# Patient Record
Sex: Female | Born: 1975 | Race: Black or African American | Hispanic: No | Marital: Single | Smoking: Current every day smoker
Health system: Southern US, Community
[De-identification: ages and names within clinical notes are randomized; demographics above are authoritative.]

## PROBLEM LIST (undated history)

## (undated) ENCOUNTER — Emergency Department: Payer: BC Managed Care – PPO

## (undated) DIAGNOSIS — K859 Acute pancreatitis without necrosis or infection, unspecified: Secondary | ICD-10-CM

## (undated) DIAGNOSIS — Z8619 Personal history of other infectious and parasitic diseases: Secondary | ICD-10-CM

## (undated) HISTORY — PX: OTHER SURGICAL HISTORY: SHX169

## (undated) HISTORY — DX: Personal history of other infectious and parasitic diseases: Z86.19

---

## 2009-02-17 ENCOUNTER — Emergency Department: Payer: Self-pay | Admitting: Emergency Medicine

## 2009-05-29 ENCOUNTER — Emergency Department: Payer: Self-pay | Admitting: Emergency Medicine

## 2010-01-18 ENCOUNTER — Emergency Department: Payer: Self-pay | Admitting: Emergency Medicine

## 2012-06-24 ENCOUNTER — Emergency Department: Payer: Self-pay

## 2012-06-24 LAB — URINALYSIS, COMPLETE
Bacteria: NONE SEEN
Blood: NEGATIVE
Nitrite: NEGATIVE
Ph: 7 (ref 4.5–8.0)
Protein: NEGATIVE
Squamous Epithelial: 5

## 2012-06-24 LAB — COMPREHENSIVE METABOLIC PANEL
Albumin: 3.6 g/dL (ref 3.4–5.0)
BUN: 7 mg/dL (ref 7–18)
Bilirubin,Total: 0.3 mg/dL (ref 0.2–1.0)
Chloride: 108 mmol/L — ABNORMAL HIGH (ref 98–107)
Co2: 25 mmol/L (ref 21–32)
Creatinine: 0.6 mg/dL (ref 0.60–1.30)
EGFR (African American): >60
EGFR (Non-African Amer.): >60
Osmolality: 277 (ref 275–301)
Potassium: 3.8 mmol/L (ref 3.5–5.1)
SGOT(AST): 25 U/L (ref 15–37)
SGPT (ALT): 15 U/L (ref 12–78)
Sodium: 140 mmol/L (ref 136–145)

## 2012-06-24 LAB — CBC
HGB: 12.5 g/dL (ref 12.0–16.0)
MCH: 32.1 pg (ref 26.0–34.0)
MCV: 98 fL (ref 80–100)
Platelet: 235 10*3/uL (ref 150–440)
RBC: 3.9 10*6/uL (ref 3.80–5.20)
RDW: 13.4 % (ref 11.5–14.5)
WBC: 3.5 10*3/uL — ABNORMAL LOW (ref 3.6–11.0)

## 2015-09-05 ENCOUNTER — Inpatient Hospital Stay
Admission: EM | Admit: 2015-09-05 | Discharge: 2015-09-08 | DRG: 440 | Disposition: A | Discharge status: Home / Self Care | Payer: Self-pay | Attending: Internal Medicine | Admitting: Internal Medicine

## 2015-09-05 ENCOUNTER — Encounter: Payer: Self-pay | Admitting: *Deleted

## 2015-09-05 DIAGNOSIS — E876 Hypokalemia: Secondary | ICD-10-CM | POA: Diagnosis present

## 2015-09-05 DIAGNOSIS — K852 Alcohol induced acute pancreatitis without necrosis or infection: Principal | ICD-10-CM | POA: Diagnosis present

## 2015-09-05 DIAGNOSIS — R740 Nonspecific elevation of levels of transaminase and lactic acid dehydrogenase [LDH]: Secondary | ICD-10-CM | POA: Diagnosis present

## 2015-09-05 DIAGNOSIS — F1010 Alcohol abuse, uncomplicated: Secondary | ICD-10-CM | POA: Diagnosis present

## 2015-09-05 DIAGNOSIS — F17210 Nicotine dependence, cigarettes, uncomplicated: Secondary | ICD-10-CM | POA: Diagnosis present

## 2015-09-05 DIAGNOSIS — R748 Abnormal levels of other serum enzymes: Secondary | ICD-10-CM | POA: Diagnosis present

## 2015-09-05 DIAGNOSIS — K859 Acute pancreatitis without necrosis or infection, unspecified: Secondary | ICD-10-CM | POA: Diagnosis present

## 2015-09-05 DIAGNOSIS — Z823 Family history of stroke: Secondary | ICD-10-CM

## 2015-09-05 HISTORY — DX: Acute pancreatitis without necrosis or infection, unspecified: K85.90

## 2015-09-05 MED ORDER — ONDANSETRON HCL 4 MG/2ML IJ SOLN: 4.0000 mg | Freq: Once | INTRAMUSCULAR | Status: DC

## 2015-09-05 MED ORDER — MORPHINE SULFATE (PF) 2 MG/ML IV SOLN: 2.0000 mg | Freq: Once | INTRAVENOUS | Status: DC

## 2015-09-05 NOTE — ED Notes (Signed)
Pt to ED via EMS from home with abd pain and emesis x 3 days. Pt states generalized abd pain, hx of pancreatitis and "feels the same" Upon arrival pt AAOx4, emesis x 2. Pt admits to drinking x 5 beers this evening. Per EMS numerous liquor bottles laying around pt and pt house where pt was laying. Vitals stable at this time, NAD noted. MD Manson PasseyBrown at bedside.

## 2015-09-06 ENCOUNTER — Inpatient Hospital Stay: Payer: Self-pay

## 2015-09-06 DIAGNOSIS — K859 Acute pancreatitis without necrosis or infection, unspecified: Secondary | ICD-10-CM | POA: Diagnosis present

## 2015-09-06 LAB — POCT PREGNANCY, URINE: Preg Test, Ur: NEGATIVE

## 2015-09-06 LAB — COMPREHENSIVE METABOLIC PANEL
ALBUMIN: 4.4 g/dL (ref 3.5–5.0)
ALK PHOS: 82 U/L (ref 38–126)
ALT: 42 U/L (ref 14–54)
ANION GAP: 12 (ref 5–15)
AST: 134 U/L — ABNORMAL HIGH (ref 15–41)
BILIRUBIN TOTAL: 0.9 mg/dL (ref 0.3–1.2)
BUN: 6 mg/dL (ref 6–20)
CALCIUM: 8.9 mg/dL (ref 8.9–10.3)
CO2: 24 mmol/L (ref 22–32)
Chloride: 100 mmol/L — ABNORMAL LOW (ref 101–111)
Creatinine, Ser: 0.62 mg/dL (ref 0.44–1.00)
GFR calc Af Amer: >60 mL/min (ref >60)
GLUCOSE: 121 mg/dL — AB (ref 65–99)
Potassium: 3.3 mmol/L — ABNORMAL LOW (ref 3.5–5.1)
Sodium: 136 mmol/L (ref 135–145)
TOTAL PROTEIN: 8.3 g/dL — AB (ref 6.5–8.1)

## 2015-09-06 LAB — LIPASE, BLOOD: LIPASE: 1621 U/L — AB (ref 11–51)

## 2015-09-06 LAB — CBC WITH DIFFERENTIAL/PLATELET
BASOS PCT: 1 %
Basophils Absolute: 0 10*3/uL (ref 0–0.1)
Eosinophils Absolute: 0 10*3/uL (ref 0–0.7)
Eosinophils Relative: 1 %
HEMATOCRIT: 38.6 % (ref 35.0–47.0)
HEMOGLOBIN: 13.1 g/dL (ref 12.0–16.0)
LYMPHS ABS: 1.5 10*3/uL (ref 1.0–3.6)
LYMPHS PCT: 40 %
MCH: 33.9 pg (ref 26.0–34.0)
MCHC: 33.9 g/dL (ref 32.0–36.0)
MCV: 99.9 fL (ref 80.0–100.0)
MONO ABS: 0.3 10*3/uL (ref 0.2–0.9)
MONOS PCT: 9 %
NEUTROS ABS: 1.8 10*3/uL (ref 1.4–6.5)
NEUTROS PCT: 49 %
Platelets: 128 10*3/uL — ABNORMAL LOW (ref 150–440)
RBC: 3.87 MIL/uL (ref 3.80–5.20)
RDW: 13.6 % (ref 11.5–14.5)
WBC: 3.7 10*3/uL (ref 3.6–11.0)

## 2015-09-06 LAB — TSH: TSH: 5.034 u[IU]/mL — ABNORMAL HIGH (ref 0.350–4.500)

## 2015-09-06 LAB — HEMOGLOBIN A1C: Hgb A1c MFr Bld: 5 % (ref 4.0–6.0)

## 2015-09-06 MED ORDER — ONDANSETRON HCL 4 MG/2ML IJ SOLN
INTRAMUSCULAR | Status: AC
  Filled 2015-09-06: qty 2

## 2015-09-06 MED ORDER — ONDANSETRON HCL 4 MG/2ML IJ SOLN
INTRAMUSCULAR | Status: AC
  Administered 2015-09-06: 4 mg via INTRAVENOUS
  Filled 2015-09-06: qty 2

## 2015-09-06 MED ORDER — POTASSIUM CHLORIDE IN NACL 40-0.9 MEQ/L-% IV SOLN
INTRAVENOUS | Status: DC
  Administered 2015-09-06 – 2015-09-07 (×3): 125 mL/h via INTRAVENOUS
  Administered 2015-09-07: 100 mL/h via INTRAVENOUS
  Filled 2015-09-06 (×8): qty 1000

## 2015-09-06 MED ORDER — MORPHINE SULFATE (PF) 2 MG/ML IV SOLN
INTRAVENOUS | Status: AC
  Administered 2015-09-06: 2 mg via INTRAVENOUS
  Filled 2015-09-06: qty 1

## 2015-09-06 MED ORDER — IOPAMIDOL (ISOVUE-300) INJECTION 61%
100.0000 mL | Freq: Once | INTRAVENOUS | Status: AC | PRN
  Administered 2015-09-06: 100 mL via INTRAVENOUS

## 2015-09-06 MED ORDER — MORPHINE SULFATE (PF) 2 MG/ML IV SOLN
INTRAVENOUS | Status: AC
  Filled 2015-09-06: qty 1

## 2015-09-06 MED ORDER — HEPARIN SODIUM (PORCINE) 5000 UNIT/ML IJ SOLN
5000.0000 [IU] | Freq: Three times a day (TID) | INTRAMUSCULAR | Status: DC
  Administered 2015-09-06: 5000 [IU] via SUBCUTANEOUS
  Filled 2015-09-06: qty 1

## 2015-09-06 MED ORDER — LORAZEPAM 2 MG/ML IJ SOLN: 0.0000 mg | Freq: Two times a day (BID) | INTRAMUSCULAR | Status: DC

## 2015-09-06 MED ORDER — OXYCODONE HCL 5 MG PO TABS
5.0000 mg | ORAL_TABLET | ORAL | Status: DC | PRN
  Administered 2015-09-07 – 2015-09-08 (×5): 5 mg via ORAL
  Filled 2015-09-06 (×6): qty 1

## 2015-09-06 MED ORDER — FOLIC ACID 1 MG PO TABS
1.0000 mg | ORAL_TABLET | Freq: Every day | ORAL | Status: DC
  Administered 2015-09-07 – 2015-09-08 (×2): 1 mg via ORAL
  Filled 2015-09-06 (×3): qty 1

## 2015-09-06 MED ORDER — MORPHINE SULFATE (PF) 2 MG/ML IV SOLN
2.0000 mg | Freq: Once | INTRAVENOUS | Status: AC
  Administered 2015-09-06: 2 mg via INTRAVENOUS

## 2015-09-06 MED ORDER — DOCUSATE SODIUM 100 MG PO CAPS
100.0000 mg | ORAL_CAPSULE | Freq: Two times a day (BID) | ORAL | Status: DC
  Administered 2015-09-06 – 2015-09-08 (×4): 100 mg via ORAL
  Filled 2015-09-06 (×5): qty 1

## 2015-09-06 MED ORDER — THIAMINE HCL 100 MG/ML IJ SOLN: 100.0000 mg | Freq: Every day | INTRAMUSCULAR | Status: DC

## 2015-09-06 MED ORDER — SODIUM CHLORIDE 0.9 % IV BOLUS (SEPSIS)
1000.0000 mL | Freq: Once | INTRAVENOUS | Status: AC
  Administered 2015-09-06: 1000 mL via INTRAVENOUS

## 2015-09-06 MED ORDER — ACETAMINOPHEN 325 MG PO TABS
650.0000 mg | ORAL_TABLET | Freq: Four times a day (QID) | ORAL | Status: DC | PRN
  Administered 2015-09-07: 650 mg via ORAL
  Filled 2015-09-06: qty 2

## 2015-09-06 MED ORDER — ADULT MULTIVITAMIN W/MINERALS CH
1.0000 | ORAL_TABLET | Freq: Every day | ORAL | Status: DC
Start: 2015-09-06 — End: 2015-09-08
  Administered 2015-09-07 – 2015-09-08 (×2): 1 via ORAL
  Filled 2015-09-06 (×3): qty 1

## 2015-09-06 MED ORDER — LORAZEPAM 2 MG/ML IJ SOLN: 1.0000 mg | Freq: Four times a day (QID) | INTRAMUSCULAR | Status: DC | PRN

## 2015-09-06 MED ORDER — ONDANSETRON HCL 4 MG PO TABS: 4.0000 mg | ORAL_TABLET | Freq: Four times a day (QID) | ORAL | Status: DC | PRN

## 2015-09-06 MED ORDER — VITAMIN B-1 100 MG PO TABS
100.0000 mg | ORAL_TABLET | Freq: Every day | ORAL | Status: DC
  Administered 2015-09-06 – 2015-09-08 (×3): 100 mg via ORAL
  Filled 2015-09-06 (×3): qty 1

## 2015-09-06 MED ORDER — ENOXAPARIN SODIUM 40 MG/0.4ML ~~LOC~~ SOLN
40.0000 mg | SUBCUTANEOUS | Status: DC
  Administered 2015-09-06 – 2015-09-07 (×2): 40 mg via SUBCUTANEOUS
  Filled 2015-09-06 (×2): qty 0.4

## 2015-09-06 MED ORDER — HYDROMORPHONE HCL 1 MG/ML IJ SOLN
1.0000 mg | INTRAMUSCULAR | Status: DC | PRN
  Administered 2015-09-06: 1 mg via INTRAVENOUS
  Filled 2015-09-06 (×2): qty 1

## 2015-09-06 MED ORDER — LORAZEPAM 1 MG PO TABS: 1.0000 mg | ORAL_TABLET | Freq: Four times a day (QID) | ORAL | Status: DC | PRN

## 2015-09-06 MED ORDER — VITAMIN B-1 100 MG PO TABS
100.0000 mg | ORAL_TABLET | Freq: Every day | ORAL | Status: DC
  Filled 2015-09-06: qty 1

## 2015-09-06 MED ORDER — LORAZEPAM 2 MG/ML IJ SOLN
0.0000 mg | Freq: Four times a day (QID) | INTRAMUSCULAR | Status: DC
  Administered 2015-09-06: 2 mg via INTRAVENOUS
  Filled 2015-09-06: qty 2

## 2015-09-06 MED ORDER — SODIUM CHLORIDE 0.9 % IV SOLN: Freq: Once | INTRAVENOUS | Status: DC

## 2015-09-06 MED ORDER — OXYCODONE HCL 5 MG PO TABS
5.0000 mg | ORAL_TABLET | ORAL | Status: DC | PRN
  Administered 2015-09-06: 5 mg via ORAL
  Filled 2015-09-06: qty 1

## 2015-09-06 MED ORDER — ONDANSETRON HCL 4 MG/2ML IJ SOLN
4.0000 mg | Freq: Once | INTRAMUSCULAR | Status: AC
  Administered 2015-09-06: 4 mg via INTRAVENOUS

## 2015-09-06 MED ORDER — MORPHINE SULFATE (PF) 2 MG/ML IV SOLN
2.0000 mg | INTRAVENOUS | Status: DC | PRN
  Administered 2015-09-06 (×2): 2 mg via INTRAVENOUS
  Filled 2015-09-06: qty 1

## 2015-09-06 MED ORDER — ONDANSETRON HCL 4 MG/2ML IJ SOLN
4.0000 mg | Freq: Four times a day (QID) | INTRAMUSCULAR | Status: DC | PRN
  Administered 2015-09-06 – 2015-09-07 (×2): 4 mg via INTRAVENOUS
  Filled 2015-09-06: qty 2

## 2015-09-06 MED ORDER — FENTANYL CITRATE (PF) 100 MCG/2ML IJ SOLN
25.0000 ug | Freq: Once | INTRAMUSCULAR | Status: AC
  Administered 2015-09-06: 25 ug via INTRAVENOUS
  Filled 2015-09-06: qty 2

## 2015-09-06 NOTE — Care Management Note (Addendum)
Case Management Note  Patient Details  Name: Barbara Bates MRN: [id number] Date of Birth: 01-09-1976  Subjective/Objective:                  Patient is Self-pay- no health insurance. Met with patient to discuss discharge planning. Patient's mother was at bedside [phone]. Patient depends on friends for transportation. She said her mother could not assist her- her mother was silent. Patient states "she can walk" but she "doesn't drive". She states she lives alone and makes less than $2500/month. She does not have a PCP.   Action/Plan: Application/referral to [facility] and Medication management delivered to patient. RNCM will continue to follow.   Expected Discharge Date:                  Expected Discharge Plan:     In-House Referral:     Discharge planning Services  CM Consult, Medication Assistance, [facility]  Post Acute Care Choice:    Choice offered to:     DME Arranged:    DME Agency:     HH Arranged:    HH Agency:     Status of Service:  In process, will continue to follow  Medicare Important Message Given:    Date Medicare IM Given:    Medicare IM give by:    Date Additional Medicare IM Given:    Additional Medicare Important Message give by:     If discussed at [place] of Stay Meetings, dates discussed:    Additional Comments:  Marshell Garfinkel, RN 09/06/2015, 12:09 PM

## 2015-09-06 NOTE — ED Notes (Signed)
CT notified this RN that imaging for pancreatitis would be more sufficient with IV contrast. MD Sheryle Hailiamond paged, verbal orders given. SEE orders.

## 2015-09-06 NOTE — Progress Notes (Signed)
  CONCERNING: IV to Oral Route Change Policy  RECOMMENDATION: This patient is receiving thiamine by the intravenous route.  Based on criteria approved by the Pharmacy and Therapeutics Committee, the intravenous medication(s) is/are being converted to the equivalent oral dose form(s).   DESCRIPTION: These criteria include:  The patient is eating (either orally or via tube) and/or has been taking other orally administered medications for a least 24 hours  The patient has no evidence of active gastrointestinal bleeding or impaired GI absorption (gastrectomy, short bowel, patient on TNA or NPO).  If you have questions about this conversion, please contact the Pharmacy Department  []   [phone]( [phone] )  [name] [x]   [phone]( [phone] )  [name] []   [phone]( [phone] )  [name] []   [phone]( [phone] )  [name] []   [phone]( [phone] )  [name]   [name],Lerlene Treadwell G, [facility] Surgery CenterRPH 09/06/2015 9:03 AM

## 2015-09-06 NOTE — H&P (Signed)
Barbara Bates is an 40 y.o. female.   Chief Complaint: Abdominal pain HPI: The patient with past medical history significant for chronic alcohol abuse presents to the emergency department complaining of abdominal pain. She states that it began 3 or 4 days ago. She has had multiple episodes of nonbloody nonbilious emesis. She admits to drinking beer throughout this period of time. Her last drink was earlier today. In the emergency department the patient was found to have significantly elevated lipase. She is unable to tolerate oral intake and admits to significant epigastric pain. She denies chest pain or shortness of breath. She was admitted to the hospitalist service for further management.  Past Medical History  Diagnosis Date  . Pancreatitis     Past Surgical History  Procedure Laterality Date  . None      Family History  Problem Relation Age of Onset  . Stroke Mother    Social History:  reports that she has been smoking.  She does not have any smokeless tobacco history on file. She reports that she drinks alcohol. Her drug history is not on file.  Allergies: No Known Allergies  Prior to Admission medications   Not on File   None  Results for orders placed or performed during the hospital encounter of 09/05/15 (from the past 48 hour(s))  CBC with Differential     Status: Abnormal   Collection Time: 09/05/15 11:52 PM  Result Value Ref Range   WBC 3.7 3.6 - 11.0 K/uL   RBC 3.87 3.80 - 5.20 MIL/uL   Hemoglobin 13.1 12.0 - 16.0 g/dL   HCT 38.6 35.0 - 47.0 %   MCV 99.9 80.0 - 100.0 fL   MCH 33.9 26.0 - 34.0 pg   MCHC 33.9 32.0 - 36.0 g/dL   RDW 13.6 11.5 - 14.5 %   Platelets 128 (L) 150 - 440 K/uL   Neutrophils Relative % 49 %   Neutro Abs 1.8 1.4 - 6.5 K/uL   Lymphocytes Relative 40 %   Lymphs Abs 1.5 1.0 - 3.6 K/uL   Monocytes Relative 9 %   Monocytes Absolute 0.3 0.2 - 0.9 K/uL   Eosinophils Relative 1 %   Eosinophils Absolute 0.0 0 - 0.7 K/uL   Basophils Relative 1 %    Basophils Absolute 0.0 0 - 0.1 K/uL  Comprehensive metabolic panel     Status: Abnormal   Collection Time: 09/05/15 11:52 PM  Result Value Ref Range   Sodium 136 135 - 145 mmol/L   Potassium 3.3 (L) 3.5 - 5.1 mmol/L   Chloride 100 (L) 101 - 111 mmol/L   CO2 24 22 - 32 mmol/L   Glucose, Bld 121 (H) 65 - 99 mg/dL   BUN 6 6 - 20 mg/dL   Creatinine, Ser 0.62 0.44 - 1.00 mg/dL   Calcium 8.9 8.9 - 10.3 mg/dL   Total Protein 8.3 (H) 6.5 - 8.1 g/dL   Albumin 4.4 3.5 - 5.0 g/dL   AST 134 (H) 15 - 41 U/L   ALT 42 14 - 54 U/L   Alkaline Phosphatase 82 38 - 126 U/L   Total Bilirubin 0.9 0.3 - 1.2 mg/dL   GFR calc non Af Amer >60 >60 mL/min   GFR calc Af Amer >60 >60 mL/min    Comment: (NOTE) The eGFR has been calculated using the CKD EPI equation. This calculation has not been validated in all clinical situations. eGFR's persistently <60 mL/min signify possible Chronic Kidney Disease.    Anion  gap 12 5 - 15  Lipase, blood     Status: Abnormal   Collection Time: 09/05/15 11:52 PM  Result Value Ref Range   Lipase 1621 (H) 11 - 51 U/L    Comment: RESULT CONFIRMED BY MANUAL DILUTION  Pregnancy, urine POC     Status: None   Collection Time: 09/06/15  3:19 AM  Result Value Ref Range   Preg Test, Ur NEGATIVE NEGATIVE    Comment:        THE SENSITIVITY OF THIS METHODOLOGY IS >24 mIU/mL    No results found.  Review of Systems  Constitutional: Negative for fever and chills.  HENT: Negative for sore throat and tinnitus.   Eyes: Negative for blurred vision and redness.  Respiratory: Negative for cough and shortness of breath.   Cardiovascular: Negative for chest pain, palpitations, orthopnea and PND.  Gastrointestinal: Positive for nausea, vomiting and abdominal pain. Negative for diarrhea.  Genitourinary: Negative for dysuria, urgency and frequency.  Musculoskeletal: Negative for myalgias and joint pain.  Skin: Negative for rash.       No lesions  Neurological: Negative for speech  change, focal weakness and weakness.  Endo/Heme/Allergies: Does not bruise/bleed easily.       No temperature intolerance  Psychiatric/Behavioral: Negative for depression and suicidal ideas.    Blood pressure 107/78, pulse 90, temperature 98.6 F (37 C), temperature source Oral, resp. rate 18, height 5' 6"  (1.676 m), weight 54.432 kg (120 lb), last menstrual period 07/19/2015, SpO2 96 %. Physical Exam  Vitals reviewed. Constitutional: She is oriented to person, place, and time. She appears well-developed and well-nourished.  HENT:  Head: Normocephalic and atraumatic.  Mouth/Throat: Oropharynx is clear and moist.  Eyes: Conjunctivae and EOM are normal. Pupils are equal, round, and reactive to light. No scleral icterus.  Neck: Normal range of motion. Neck supple. No JVD present. No tracheal deviation present. No thyromegaly present.  Cardiovascular: Normal rate, regular rhythm and normal heart sounds.  Exam reveals no gallop and no friction rub.   No murmur heard. Respiratory: Effort normal and breath sounds normal.  GI: Soft. Bowel sounds are normal. She exhibits no distension and no mass. There is tenderness. There is no rebound and no guarding.  Genitourinary:  Deferred  Musculoskeletal: Normal range of motion. She exhibits no edema.  Lymphadenopathy:    She has no cervical adenopathy.  Neurological: She is alert and oriented to person, place, and time. No cranial nerve deficit. She exhibits normal muscle tone.  Skin: Skin is warm and dry. No rash noted. No erythema.  Psychiatric: She has a normal mood and affect. Her behavior is normal. Judgment and thought content normal.     Assessment/Plan This is a 40 year old female admitted for pancreatitis. 1. Pancreatitis: I will obtain CT of the abdomen to rule out necrosis. I started the patient on a clear liquid diet and we will manage her pain as best we can. 2. Alcohol abuse: Initiate CIWA scale. 3. Transaminitis: Secondary to alcohol  abuse. Hydrate with intravenous fluid. 4. DVT prophylaxis: Heparin 5. GI prophylaxis: None The patient is a full code. Time spent on admission orders and patient care approximately 45 minutes  Harrie Foreman, MD 09/06/2015, 3:54 AM

## 2015-09-06 NOTE — ED Notes (Signed)
Pt having 10 out of 10 pain in abd and nausea

## 2015-09-06 NOTE — ED Provider Notes (Signed)
[facility] Emergency Department Provider Note  ____________________________________________  Time seen: 1:30 AM AM  I have reviewed the triage vital signs and the nursing notes.   HISTORY  Chief Complaint Abdominal Pain and Emesis      HPI Barbara Bates is a 40 y.o. female presents with 10 out of 10 epigastric abdominal pain accompanied by vomiting 3 days. Patient admits to history of pancreatitis in the past and states that this is similar to that event. Patient denies any fever no diarrhea. Patient admits to heavy EtOH daily stating that she had 5 or 6 beers today.   Past Medical History  Diagnosis Date  . Pancreatitis     Patient Active Problem List   Diagnosis Date Noted  . Pancreatitis 09/06/2015    Past surgical history None  No current outpatient prescriptions on file.  Allergies No known drug allergies History reviewed. No pertinent family history.  Social History Social History  Substance Use Topics  . Smoking status: Current Every Day Smoker  . Smokeless tobacco: None  . Alcohol Use: Yes     Comment: 5-6 beers per day     Review of Systems  Constitutional: Negative for fever. Eyes: Negative for visual changes. ENT: Negative for sore throat. Cardiovascular: Negative for chest pain. Respiratory: Negative for shortness of breath. Gastrointestinal: Positive for abdominal pain and vomiting Genitourinary: Negative for dysuria. Musculoskeletal: Negative for back pain. Skin: Negative for rash. Neurological: Negative for headaches, focal weakness or numbness.   10-point ROS otherwise negative.  ____________________________________________   PHYSICAL EXAM:  VITAL SIGNS: ED Triage Vitals  Enc Vitals Group     BP 09/05/15 2348 122/64 mmHg     Pulse Rate 09/05/15 2348 85     Resp 09/05/15 2348 25     Temp 09/05/15 2348 98.6 F (37 C)     Temp Source 09/05/15 2348 Oral     SpO2 09/05/15 2348 98 %     Weight 09/05/15  2351 120 lb (54.432 kg)     Height 09/05/15 2351  (1.676 m)     Head Cir --      Peak Flow --      Pain Score 09/05/15 2349 10     Pain Loc --      Pain Edu? --      Excl. in GC? --     Constitutional: Alert and oriented. Well appearing and in no distress. Eyes: Conjunctivae are normal. PERRL. Normal extraocular movements. ENT   Head: Normocephalic and atraumatic.   Nose: No congestion/rhinnorhea.   Mouth/Throat: Mucous membranes are moist.   Neck: No stridor. Hematological/Lymphatic/Immunilogical: No cervical lymphadenopathy. Cardiovascular: Normal rate, regular rhythm. Normal and symmetric distal pulses are present in all extremities. No murmurs, rubs, or gallops. Respiratory: Normal respiratory effort without tachypnea nor retractions. Breath sounds are clear and equal bilaterally. No wheezes/rales/rhonchi. Gastrointestinal: Epigastric tenderness to palpation. No distention. There is no CVA tenderness. Genitourinary: deferred Musculoskeletal: Nontender with normal range of motion in all extremities. No joint effusions.  No lower extremity tenderness nor edema. Neurologic:  Normal speech and language. No gross focal neurologic deficits are appreciated. Speech is normal.  Skin:  Skin is warm, dry and intact. No rash noted. Psychiatric: Mood and affect are normal. Speech and behavior are normal. Patient exhibits appropriate insight and judgment.  ____________________________________________    LABS (pertinent positives/negatives)  Labs Reviewed  CBC WITH DIFFERENTIAL/PLATELET - Abnormal; Notable for the following:    Platelets 128 (*)  All other components within normal limits  COMPREHENSIVE METABOLIC PANEL - Abnormal; Notable for the following:    Potassium 3.3 (*)    Chloride 100 (*)    Glucose, Bld 121 (*)    Total Protein 8.3 (*)    AST 134 (*)    All other components within normal limits  LIPASE, BLOOD - Abnormal; Notable for the following:     Lipase 1621 (*)    All other components within normal limits  POCT PREGNANCY, URINE  POC URINE PREG, ED      RADIOLOGY     CT Abdomen Pelvis W Contrast (Final result) Result time: 09/06/15 03:53:24   Final result by Rad Results In Interface (09/06/15 03:53:24)   Narrative:   CLINICAL DATA: Pancreatitis  EXAM: CT ABDOMEN AND PELVIS WITH CONTRAST  TECHNIQUE: Multidetector CT imaging of the abdomen and pelvis was performed using the standard protocol following bolus administration of intravenous contrast.  CONTRAST: 100mL ISOVUE-300 IOPAMIDOL (ISOVUE-300) INJECTION 61%  COMPARISON: None.  FINDINGS: Lower chest and abdominal wall: No contributory findings.  Hepatobiliary: Hepatic steatosis. No focal finding.No evidence of biliary obstruction or stone.  Pancreas: Expanded with peripancreatic edema consistent with history of pancreatitis. No organized collection or necrosis. No vascular complication noted.  Spleen: Unremarkable.  Adrenals/Urinary Tract: Negative adrenals. No hydronephrosis or stone. Collapsed bladder.  Reproductive:Bilateral cystic tubular shaped structures in the bilateral adnexa. Appearance suggests hydrosalpinx, but these cystic structures are not discerned separate from the ovaries.  Stomach/Bowel: Low-density thick appearance of the distal stomach with prominent mucosal enhancement. No ulceration or perforation is seen. No obstruction. No appendicitis.  Vascular/Lymphatic: No acute vascular abnormality. The IVC is effaced at the level of the duodenum, likely transient, no occlusion or thrombosis. No mass or adenopathy.  Peritoneal: Small pelvic ascites that is likely reactive.  Musculoskeletal: No acute abnormalities.  IMPRESSION: 1. Acute pancreatitis. No organized collection or necrosis. 2. Distal gastritis. 3. Hepatic steatosis. 4. 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   Electronically Signed By: Marnee SpringJonathon Watts M.D. On:  09/06/2015 03:53           INITIAL IMPRESSION / ASSESSMENT AND PLAN / ED COURSE  Pertinent labs & imaging results that were available during my care of the patient were reviewed by me and considered in my medical decision making (see chart for details).  Patient given IV morphine emergency department as well as Zofran. Current pain score 9 out of 10 status post 4 mg of IV morphine  ____________________________________________   FINAL CLINICAL IMPRESSION(S) / ED DIAGNOSES  Final diagnoses:  Pancreatitis      Darci Currentandolph N Darnel Mchan, MD 09/06/15 [phone]

## 2015-09-06 NOTE — Progress Notes (Signed)
[facility] [place] [facility]   PATIENT NAME: Barbara Bates    MRN#:  [id number]  DATE OF BIRTH:  [birth year]  SUBJECTIVE:  Hospital Day: 0 days Barbara Bates is a 40 y.o. female presenting with Abdominal Pain and Emesis .   Overnight events: No overnight events Interval Events: Still complains of abdominal pain epigastric  REVIEW OF SYSTEMS:  CONSTITUTIONAL: No fever, fatigue or weakness.  EYES: No blurred or double vision.  EARS, NOSE, AND THROAT: No tinnitus or ear pain.  RESPIRATORY: No cough, shortness of breath, wheezing or hemoptysis.  CARDIOVASCULAR: No chest pain, orthopnea, edema.  GASTROINTESTINAL: Positive nausea, abdominal pain. Denies vomiting, diarrhea  GENITOURINARY: No dysuria, hematuria.  ENDOCRINE: No polyuria, nocturia,  HEMATOLOGY: No anemia, easy bruising or bleeding SKIN: No rash or lesion. MUSCULOSKELETAL: No joint pain or arthritis.   NEUROLOGIC: No tingling, numbness, weakness.  PSYCHIATRY: No anxiety or depression.   DRUG ALLERGIES:  No Known Allergies  VITALS:  Blood pressure 111/70, pulse 79, temperature 98.6 F (37 C), temperature source Oral, resp. rate 22, height  (1.676 m), weight 118 lb (53.524 kg), last menstrual period 07/19/2015, SpO2 100 %.  PHYSICAL EXAMINATION:  VITAL SIGNS: Filed Vitals:   09/06/15 0559 09/06/15 0754  BP: 129/88 111/70  Pulse: 80 79  Temp: 99.1 F (37.3 C) 98.6 F (37 C)  Resp: 20 22   GENERAL:39 y.o.female currently in no acute distress.  HEAD: Normocephalic, atraumatic.  EYES: Pupils equal, round, reactive to light. Extraocular muscles intact. No scleral icterus.  MOUTH: Moist mucosal membrane. Dentition intact. No abscess noted.  EAR, NOSE, THROAT: Clear without exudates. No external lesions.  NECK: Supple. No thyromegaly. No nodules. No JVD.  PULMONARY: Clear to ascultation, without wheeze rails or rhonci. No use of accessory muscles, Good respiratory effort. good air  entry bilaterally CHEST: Nontender to palpation.  CARDIOVASCULAR: S1 and S2. Regular rate and rhythm. No murmurs, rubs, or gallops. No edema. Pedal pulses 2+ bilaterally.  GASTROINTESTINAL: Soft, minimal tenderness palpation epigastric region without rebound or guarding, nondistended. No masses. Positive bowel sounds. No hepatosplenomegaly.  MUSCULOSKELETAL: No swelling, clubbing, or edema. Range of motion full in all extremities.  NEUROLOGIC: Cranial nerves II through XII are intact. No gross focal neurological deficits. Sensation intact. Reflexes intact.  SKIN: No ulceration, lesions, rashes, or cyanosis. Skin warm and dry. Turgor intact.  PSYCHIATRIC: Mood, affect within normal limits. The patient is awake, alert and oriented x 3. Insight, judgment intact.      LABORATORY PANEL:   CBC  Recent Labs Lab 09/05/15 2352  WBC 3.7  HGB 13.1  HCT 38.6  PLT 128*   ------------------------------------------------------------------------------------------------------------------  Chemistries   Recent Labs Lab 09/05/15 2352  NA 136  K 3.3*  CL 100*  CO2 24  GLUCOSE 121*  BUN 6  CREATININE 0.62  CALCIUM 8.9  AST 134*  ALT 42  ALKPHOS 82  BILITOT 0.9   ------------------------------------------------------------------------------------------------------------------  Cardiac Enzymes No results for input(s): TROPONINI in the last 168 hours. ------------------------------------------------------------------------------------------------------------------  RADIOLOGY:  Ct Abdomen Pelvis W Contrast  09/06/2015  CLINICAL DATA:  Pancreatitis EXAM: CT ABDOMEN AND PELVIS WITH CONTRAST TECHNIQUE: Multidetector CT imaging of the abdomen and pelvis was performed using the standard protocol following bolus administration of intravenous contrast. CONTRAST:  ISOVUE-300 IOPAMIDOL (ISOVUE-300) INJECTION 61% COMPARISON:  None. FINDINGS: Lower chest and abdominal wall:  No contributory  findings. Hepatobiliary: Hepatic steatosis. No focal finding.No evidence of biliary obstruction or stone. Pancreas: Expanded with  peripancreatic edema consistent with history of pancreatitis. No organized collection or necrosis. No vascular complication noted. Spleen: Unremarkable. Adrenals/Urinary Tract: Negative adrenals. No hydronephrosis or stone. Collapsed bladder. Reproductive:Bilateral cystic tubular shaped structures in the bilateral adnexa. Appearance suggests hydrosalpinx, but these cystic structures are not discerned separate from the ovaries. Stomach/Bowel: Low-density thick appearance of the distal stomach with prominent mucosal enhancement. No ulceration or perforation is seen. No obstruction. No appendicitis. Vascular/Lymphatic: No acute vascular abnormality. The IVC is effaced at the level of the duodenum, likely transient, no occlusion or thrombosis. No mass or adenopathy. Peritoneal: Small pelvic ascites that is likely reactive. Musculoskeletal: No acute abnormalities. IMPRESSION: 1. Acute pancreatitis.  No organized collection or necrosis. 2. Distal gastritis. 3. Hepatic steatosis. 4. 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 Electronically Signed   By: Marnee SpringJonathon  Watts M.D.   On: 09/06/2015 03:53    EKG:   Orders placed or performed in visit on 02/17/09  . EKG 12-Lead    ASSESSMENT AND PLAN:   Barbara Bates is a 40 y.o. female presenting with Abdominal Pain and Emesis . Admitted 09/05/2015 : Day #: 0 days 1. Acute pancreatitis alcoholic: Continue supportive measures including IV fluid hydration and pain medications and by mouth status advance diet as tolerated when pain subsides 2. Alcohol abuse: CIWA protocol 3. Hypokalemia replace and follow goals potassium 4-5  All the records are reviewed and case discussed with Care Management/Social Workerr. Management plans discussed with the patient, family and they are in agreement.  CODE STATUS: full TOTAL TIME TAKING CARE OF THIS PATIENT: 28  minutes.   POSSIBLE D/C IN 1-2DAYS, DEPENDING ON CLINICAL CONDITION.   Hower,  Mardi MainlandDavid K M.D on 09/06/2015 at 1:45 PM  Between 7am to 6pm - Pager - [phone]  After 6pm: House Pager: - [phone]  [facility]  Office  [phone]  CC: Primary care physician; No primary care provider on file.

## 2015-09-06 NOTE — ED Notes (Signed)
RN entered room to awake pt in order to check on pt. Pt awoke from sleep c/c of 10/10 abd pain, stating "that medicine didn't help at all." MD Manson PasseyBrown made aware at this time. Pt given ginger ale, explained to sip and let RN know if able to tolerate.

## 2015-09-06 NOTE — ED Notes (Signed)
MD Manson PasseyBrown at bedside re assessing pt at this time.

## 2015-09-06 NOTE — Progress Notes (Signed)
Patient is A&O x4, up with SBA. IV fluids running into NSL in RFA.  Received 1L bolus this am. CIWAs WNL. Gave IV PRN pain med x1, with good relief. Patient slept most of the day. Emesis this am, tolerated PO meds this afternoon. Bed alarm on for safety. Tolerated a clear liquid diet this afternoon.  Urinating without difficultly. No BM this shift.

## 2015-09-07 LAB — BASIC METABOLIC PANEL
Anion gap: 4 — ABNORMAL LOW (ref 5–15)
BUN: 5 mg/dL — AB (ref 6–20)
CHLORIDE: 105 mmol/L (ref 101–111)
CO2: 26 mmol/L (ref 22–32)
Calcium: 7.9 mg/dL — ABNORMAL LOW (ref 8.9–10.3)
Creatinine, Ser: 0.51 mg/dL (ref 0.44–1.00)
GFR calc Af Amer: >60 mL/min (ref >60)
GFR calc non Af Amer: >60 mL/min (ref >60)
GLUCOSE: 84 mg/dL (ref 65–99)
POTASSIUM: 4 mmol/L (ref 3.5–5.1)
Sodium: 135 mmol/L (ref 135–145)

## 2015-09-07 LAB — T4, FREE: FREE T4: 0.74 ng/dL (ref 0.61–1.12)

## 2015-09-07 MED ORDER — OXYCODONE-ACETAMINOPHEN 7.5-325 MG PO TABS
1.0000 | ORAL_TABLET | ORAL | Status: DC | PRN
Start: 2015-09-07 — End: 2017-12-22

## 2015-09-07 NOTE — Plan of Care (Signed)
Problem: Nutrition: Goal: Adequate nutrition will be maintained Outcome: Not Progressing Pt still on clear liquids. Lipase very elevated on admission. Pt vomited tonight and received Zofran with improvement.

## 2015-09-07 NOTE — Progress Notes (Addendum)
[facility] [place] at [facility]   PATIENT NAME: Barbara Bates    MRN#:  [id number]  DATE OF BIRTH:  04/02/1975  SUBJECTIVE:  Hospital Day: 1 day Barbara Bates is a 40 y.o. female presenting with Abdominal Pain and Emesis .   Overnight events: No overnight events Interval Events: Pain actually improving however some nauseousness still present  REVIEW OF SYSTEMS:  CONSTITUTIONAL: No fever, fatigue or weakness.  EYES: No blurred or double vision.  EARS, NOSE, AND THROAT: No tinnitus or ear pain.  RESPIRATORY: No cough, shortness of breath, wheezing or hemoptysis.  CARDIOVASCULAR: No chest pain, orthopnea, edema.  GASTROINTESTINAL: Positive nausea, abdominal pain. Denies vomiting, diarrhea  GENITOURINARY: No dysuria, hematuria.  ENDOCRINE: No polyuria, nocturia,  HEMATOLOGY: No anemia, easy bruising or bleeding SKIN: No rash or lesion. MUSCULOSKELETAL: No joint pain or arthritis.   NEUROLOGIC: No tingling, numbness, weakness.  PSYCHIATRY: No anxiety or depression.   DRUG ALLERGIES:  No Known Allergies  VITALS:  Blood pressure 131/87, pulse 82, temperature 98.5 F (36.9 C), temperature source Oral, resp. rate 18, height 5\' 6"  (1.676 m), weight 122 lb 9.6 oz (55.611 kg), last menstrual period 07/19/2015, SpO2 100 %.  PHYSICAL EXAMINATION:  VITAL SIGNS: Filed Vitals:   09/07/15 0516 09/07/15 0802  BP: 126/90 131/87  Pulse: 83 82  Temp: 98.4 F (36.9 C) 98.5 F (36.9 C)  Resp: 18    GENERAL:39 y.o.female currently in no acute distress.  HEAD: Normocephalic, atraumatic.  EYES: Pupils equal, round, reactive to light. Extraocular muscles intact. No scleral icterus.  MOUTH: Moist mucosal membrane. Dentition intact. No abscess noted.  EAR, NOSE, THROAT: Clear without exudates. No external lesions.  NECK: Supple. No thyromegaly. No nodules. No JVD.  PULMONARY: Clear to ascultation, without wheeze rails or rhonci. No use of accessory muscles, Good  respiratory effort. good air entry bilaterally CHEST: Nontender to palpation.  CARDIOVASCULAR: S1 and S2. Regular rate and rhythm. No murmurs, rubs, or gallops. No edema. Pedal pulses 2+ bilaterally.  GASTROINTESTINAL: Soft, minimal tenderness palpation epigastric region without rebound or guarding, nondistended. No masses. Positive bowel sounds. No hepatosplenomegaly.  MUSCULOSKELETAL: No swelling, clubbing, or edema. Range of motion full in all extremities.  NEUROLOGIC: Cranial nerves II through XII are intact. No gross focal neurological deficits. Sensation intact. Reflexes intact.  SKIN: No ulceration, lesions, rashes, or cyanosis. Skin warm and dry. Turgor intact.  PSYCHIATRIC: Mood, affect within normal limits. The patient is awake, alert and oriented x 3. Insight, judgment intact.      LABORATORY PANEL:   CBC  Recent Labs Lab 09/05/15 2352  WBC 3.7  HGB 13.1  HCT 38.6  PLT 128*   ------------------------------------------------------------------------------------------------------------------  Chemistries   Recent Labs Lab 09/05/15 2352 09/07/15 0437  NA 136 135  K 3.3* 4.0  CL 100* 105  CO2 24 26  GLUCOSE 121* 84  BUN 6 5*  CREATININE 0.62 0.51  CALCIUM 8.9 7.9*  AST 134*  --   ALT 42  --   ALKPHOS 82  --   BILITOT 0.9  --    ------------------------------------------------------------------------------------------------------------------  Cardiac Enzymes No results for input(s): TROPONINI in the last 168 hours. ------------------------------------------------------------------------------------------------------------------  RADIOLOGY:  Ct Abdomen Pelvis W Contrast  09/06/2015  CLINICAL DATA:  Pancreatitis EXAM: CT ABDOMEN AND PELVIS WITH CONTRAST TECHNIQUE: Multidetector CT imaging of the abdomen and pelvis was performed using the standard protocol following bolus administration of intravenous contrast. CONTRAST:  100mL ISOVUE-300 IOPAMIDOL (ISOVUE-300)  INJECTION 61% COMPARISON:  None. FINDINGS: Lower chest and abdominal wall:  No contributory findings. Hepatobiliary: Hepatic steatosis. No focal finding.No evidence of biliary obstruction or stone. Pancreas: Expanded with peripancreatic edema consistent with history of pancreatitis. No organized collection or necrosis. No vascular complication noted. Spleen: Unremarkable. Adrenals/Urinary Tract: Negative adrenals. No hydronephrosis or stone. Collapsed bladder. Reproductive:Bilateral cystic tubular shaped structures in the bilateral adnexa. Appearance suggests hydrosalpinx, but these cystic structures are not discerned separate from the ovaries. Stomach/Bowel: Low-density thick appearance of the distal stomach with prominent mucosal enhancement. No ulceration or perforation is seen. No obstruction. No appendicitis. Vascular/Lymphatic: No acute vascular abnormality. The IVC is effaced at the level of the duodenum, likely transient, no occlusion or thrombosis. No mass or adenopathy. Peritoneal: Small pelvic ascites that is likely reactive. Musculoskeletal: No acute abnormalities. IMPRESSION: 1. Acute pancreatitis.  No organized collection or necrosis. 2. Distal gastritis. 3. Hepatic steatosis. 4. 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 Electronically Signed   By: Marnee Spring M.D.   On: 09/06/2015 03:53    EKG:   Orders placed or performed in visit on 02/17/09  . EKG 12-Lead    ASSESSMENT AND PLAN:   Barbara Bates is a 40 y.o. female presenting with Abdominal Pain and Emesis . Admitted 09/05/2015 : Day #: 1 day 1. Acute pancreatitis alcoholic: Continue supportive measures including IV fluid hydration and pain medications and by mouth status advance diet as tolerated when pain subsides 2. Alcohol abuse: CIWA protocol 3. Hypokalemia replace and follow goals potassium 4-5 4. Abnormal TFT: check free T4 All the records are reviewed and case discussed with Care Management/Social Workerr. Management plans discussed  with the patient, family and they are in agreement.  CODE STATUS: full TOTAL TIME TAKING CARE OF THIS PATIENT: 28 minutes.   POSSIBLE D/C IN 1-2DAYS, DEPENDING ON CLINICAL CONDITION.   Hower,  Mardi Mainland.D on 09/07/2015 at 1:51 PM  Between 7am to 6pm - Pager - [phone]  After 6pm: House Pager: - [phone]  [facility]  Office  [phone]  CC: Primary care physician; No primary care provider on file.

## 2015-09-08 LAB — CBC
HEMATOCRIT: 29.3 % — AB (ref 35.0–47.0)
Hemoglobin: 9.9 g/dL — ABNORMAL LOW (ref 12.0–16.0)
MCH: 34.9 pg — AB (ref 26.0–34.0)
MCHC: 33.9 g/dL (ref 32.0–36.0)
MCV: 103 fL — AB (ref 80.0–100.0)
PLATELETS: 100 10*3/uL — AB (ref 150–440)
RBC: 2.84 MIL/uL — ABNORMAL LOW (ref 3.80–5.20)
RDW: 12.9 % (ref 11.5–14.5)
WBC: 3.5 10*3/uL — ABNORMAL LOW (ref 3.6–11.0)

## 2015-09-08 LAB — BASIC METABOLIC PANEL
ANION GAP: 5 (ref 5–15)
CO2: 24 mmol/L (ref 22–32)
Calcium: 8 mg/dL — ABNORMAL LOW (ref 8.9–10.3)
Chloride: 103 mmol/L (ref 101–111)
Creatinine, Ser: 0.44 mg/dL (ref 0.44–1.00)
GFR calc Af Amer: >60 mL/min (ref >60)
GLUCOSE: 68 mg/dL (ref 65–99)
POTASSIUM: 4.2 mmol/L (ref 3.5–5.1)
Sodium: 132 mmol/L — ABNORMAL LOW (ref 135–145)

## 2015-09-08 NOTE — Discharge Summary (Signed)
[facility] - [place] at [facility]   PATIENT NAME: Barbara Bates    MR#:  [id number]  DATE OF BIRTH:  09-06-1975  DATE OF ADMISSION:  09/05/2015 ADMITTING PHYSICIAN: Arnaldo Natal, MD  DATE OF DISCHARGE: 09/08/2015  PRIMARY CARE PHYSICIAN: No primary care provider on file.    ADMISSION DIAGNOSIS:  Pancreatitis [K85.9] Alcohol-induced acute pancreatitis without infection or necrosis [K85.20]  DISCHARGE DIAGNOSIS:  Active Problems:  Alcohol-induced acute pancreatitis without infection or necrosis [K85.20]   SECONDARY DIAGNOSIS:   Past Medical History  Diagnosis Date  . Pancreatitis     HOSPITAL COURSE:  Barbara Bates  is a 40 y.o. female admitted 09/05/2015 with chief complaint Abdominal Pain and Emesis . Please see H&P performed by Arnaldo Natal, MD for further information. She presented with the above symptoms, found to have an elevated lipase consistent with pancreatitis. She received IV fluids, pain medications with eventual improvement of symptoms. She is tolerating an oral diet on the day of discharge.  DISCHARGE CONDITIONS:   stable  CONSULTS OBTAINED:     DRUG ALLERGIES:  No Known Allergies  DISCHARGE MEDICATIONS:   Current Discharge Medication List    START taking these medications   Details  oxyCODONE-acetaminophen (PERCOCET) 7.5-325 MG tablet Take 1 tablet by mouth every 4 (four) hours as needed for severe pain. Qty: 30 tablet, Refills: 0         DISCHARGE INSTRUCTIONS:    DIET:  Regular diet  DISCHARGE CONDITION:  Good  ACTIVITY:  Activity as tolerated  OXYGEN:  Home Oxygen: No.   Oxygen Delivery: room air  DISCHARGE LOCATION:  home   If you experience worsening of your admission symptoms, develop shortness of breath, life threatening emergency, suicidal or homicidal thoughts you must seek medical attention immediately by calling 911 or calling your MD immediately  if symptoms less severe.  You Must read  complete instructions/literature along with all the possible adverse reactions/side effects for all the Medicines you take and that have been prescribed to you. Take any new Medicines after you have completely understood and accpet all the possible adverse reactions/side effects.   Please note  You were cared for by a hospitalist during your hospital stay. If you have any questions about your discharge medications or the care you received while you were in the hospital after you are discharged, you can call the unit and asked to speak with the hospitalist on call if the hospitalist that took care of you is not available. Once you are discharged, your primary care physician will handle any further medical issues. Please note that NO REFILLS for any discharge medications will be authorized once you are discharged, as it is imperative that you return to your primary care physician (or establish a relationship with a primary care physician if you do not have one) for your aftercare needs so that they can reassess your need for medications and monitor your lab values.    On the day of Discharge:   VITAL SIGNS:  Blood pressure 133/85, pulse 80, temperature 98.1 F (36.7 C), temperature source Oral, resp. rate 18, height 5\' 6"  (1.676 m), weight 133 lb 3.2 oz (60.419 kg), last menstrual period 07/19/2015, SpO2 100 %.  I/O:   Intake/Output Summary (Last 24 hours) at 09/08/15 1025 Last data filed at 09/08/15 0900  Gross per 24 hour  Intake 3531.25 ml  Output      0 ml  Net 3531.25 ml    PHYSICAL EXAMINATION:  GENERAL:  40 y.o.-year-old patient lying in the bed with no acute distress.  EYES: Pupils equal, round, reactive to light and accommodation. No scleral icterus. Extraocular muscles intact.  HEENT: Head atraumatic, normocephalic. Oropharynx and nasopharynx clear.  NECK:  Supple, no jugular venous distention. No thyroid enlargement, no tenderness.  LUNGS: Normal breath sounds bilaterally, no  wheezing, rales,rhonchi or crepitation. No use of accessory muscles of respiration.  CARDIOVASCULAR: S1, S2 normal. No murmurs, rubs, or gallops.  ABDOMEN: Soft, non-tender, non-distended. Bowel sounds present. No organomegaly or mass.  EXTREMITIES: No pedal edema, cyanosis, or clubbing.  NEUROLOGIC: Cranial nerves II through XII are intact. Muscle strength 5/5 in all extremities. Sensation intact. Gait not checked.  PSYCHIATRIC: The patient is alert and oriented x 3.  SKIN: No obvious rash, lesion, or ulcer.   DATA REVIEW:   CBC  Recent Labs Lab 09/08/15 0553  WBC 3.5*  HGB 9.9*  HCT 29.3*  PLT 100*    Chemistries   Recent Labs Lab 09/05/15 2352  09/08/15 0553  NA 136  < > 132*  K 3.3*  < > 4.2  CL 100*  < > 103  CO2 24  < > 24  GLUCOSE 121*  < > 68  BUN 6  < > <5*  CREATININE 0.62  < > 0.44  CALCIUM 8.9  < > 8.0*  AST 134*  --   --   ALT 42  --   --   ALKPHOS 82  --   --   BILITOT 0.9  --   --   < > = values in this interval not displayed.  Cardiac Enzymes No results for input(s): TROPONINI in the last 168 hours.  Microbiology Results  Results for orders placed or performed in visit on 06/24/12  Wet prep, genital     Status: None   Collection Time: 06/24/12 10:17 PM  Result Value Ref Range Status   Micro Text Report   Final       COMMENT                   FEW WHITE BLOOD CELLS SEEN   COMMENT                   CLUE CELLS SEEN   COMMENT                   TRICHOMONAS SEEN   COMMENT                   NO SPERMATOZOA SEEN   COMMENT                   NO YEAST SEEN   ANTIBIOTIC                                                        RADIOLOGY:  No results found.   Management plans discussed with the patient, family and they are in agreement.  CODE STATUS:     Code Status Orders        Start     Ordered   09/06/15 0537  Full code   Continuous     09/06/15 0536    Code Status History    Date Active Date Inactive Code Status Order ID Comments User  Context  This patient has a current code status but no historical code status.      TOTAL TIME TAKING CARE OF THIS PATIENT: 28 minutes.    Hower,  Mardi Mainland.D on 09/08/2015 at 10:25 AM  Between 7am to 6pm - Pager - [phone]  After 6pm go to [email or web address] - Scientist, research (life sciences) [facility]  Office  [phone]  CC: Primary care physician; No primary care provider on file.

## 2015-09-08 NOTE — Progress Notes (Signed)
Barbara FalcoAretha L Troeger to be D/C'd Home per MD order.  Discussed with the patient and all questions fully answered.  VSS, Skin clean, dry and intact without evidence of skin break down, no evidence of skin tears noted. IV catheter discontinued intact. Site without signs and symptoms of complications. Dressing and pressure applied.  An After Visit Summary was printed and given to the patient. Patient received prescription.  D/c education completed with patient/family including follow up instructions, medication list, d/c activities limitations if indicated, with other d/c instructions as indicated by MD - patient able to verbalize understanding, all questions fully answered.   Patient instructed to return to ED, call 911, or call MD for any changes in condition.   Patient escorted via WC, and D/C home via private auto.  Shawna OrleansMelanie Arien Benincasa 09/08/2015 11:11 AM

## 2015-09-08 NOTE — Progress Notes (Signed)
If tolerate advanced diet can dc later today - full note to follow

## 2015-09-08 NOTE — Progress Notes (Signed)
   [place] [facility] AT [facility] [address] [place], [place] [address]  September 08, 2015  Patient:  Lindell Sparretha Achille Date of Birth: 03-06-1976 Date of Visit:  09/05/2015  To Whom it May Concern:  Please excuse Rufina FalcoAretha L Tomczak from work from 09/05/2015 until 09/08/2015 as she was [facility] for [facility] and has been receiving appropriate care. She may return to work on 09/11/15, sooner if she feels she is able to return sooner than this date.      Please don't hesitate to contact me with questions or concerns by calling  [phone] and asking them to page me directly.   Marge Duncansave Kaysen Deal, MD

## 2015-12-24 ENCOUNTER — Encounter: Payer: Self-pay | Admitting: Emergency Medicine

## 2015-12-24 ENCOUNTER — Emergency Department
Admission: EM | Admit: 2015-12-24 | Discharge: 2015-12-25 | Disposition: A | Discharge status: Home / Self Care | Payer: Self-pay | Attending: Emergency Medicine | Admitting: Emergency Medicine

## 2015-12-24 DIAGNOSIS — N390 Urinary tract infection, site not specified: Secondary | ICD-10-CM | POA: Insufficient documentation

## 2015-12-24 DIAGNOSIS — F17200 Nicotine dependence, unspecified, uncomplicated: Secondary | ICD-10-CM | POA: Insufficient documentation

## 2015-12-24 DIAGNOSIS — R109 Unspecified abdominal pain: Secondary | ICD-10-CM

## 2015-12-24 DIAGNOSIS — Z791 Long term (current) use of non-steroidal anti-inflammatories (NSAID): Secondary | ICD-10-CM | POA: Insufficient documentation

## 2015-12-24 LAB — COMPREHENSIVE METABOLIC PANEL
ALT: 11 U/L — ABNORMAL LOW (ref 14–54)
ANION GAP: 15 (ref 5–15)
AST: 20 U/L (ref 15–41)
Albumin: 2.8 g/dL — ABNORMAL LOW (ref 3.5–5.0)
Alkaline Phosphatase: 86 U/L (ref 38–126)
BILIRUBIN TOTAL: 1.1 mg/dL (ref 0.3–1.2)
BUN: 20 mg/dL (ref 6–20)
CO2: 24 mmol/L (ref 22–32)
Calcium: 9 mg/dL (ref 8.9–10.3)
Chloride: 94 mmol/L — ABNORMAL LOW (ref 101–111)
Creatinine, Ser: 1.16 mg/dL — ABNORMAL HIGH (ref 0.44–1.00)
GFR, EST NON AFRICAN AMERICAN: 57 mL/min — AB (ref >60)
Glucose, Bld: 81 mg/dL (ref 65–99)
POTASSIUM: 3.7 mmol/L (ref 3.5–5.1)
Sodium: 133 mmol/L — ABNORMAL LOW (ref 135–145)
TOTAL PROTEIN: 8.3 g/dL — AB (ref 6.5–8.1)

## 2015-12-24 LAB — CBC WITH DIFFERENTIAL/PLATELET
BASOS PCT: 0 %
Basophils Absolute: 0 10*3/uL (ref 0–0.1)
Eosinophils Absolute: 0 10*3/uL (ref 0–0.7)
Eosinophils Relative: 0 %
HEMATOCRIT: 34.3 % — AB (ref 35.0–47.0)
Hemoglobin: 11.7 g/dL — ABNORMAL LOW (ref 12.0–16.0)
LYMPHS PCT: 9 %
Lymphs Abs: 1 10*3/uL (ref 1.0–3.6)
MCH: 34.4 pg — ABNORMAL HIGH (ref 26.0–34.0)
MCHC: 34.2 g/dL (ref 32.0–36.0)
MCV: 100.4 fL — AB (ref 80.0–100.0)
MONO ABS: 0.9 10*3/uL (ref 0.2–0.9)
MONOS PCT: 8 %
NEUTROS ABS: 8.7 10*3/uL — AB (ref 1.4–6.5)
Neutrophils Relative %: 83 %
Platelets: 404 10*3/uL (ref 150–440)
RBC: 3.41 MIL/uL — ABNORMAL LOW (ref 3.80–5.20)
RDW: 12.9 % (ref 11.5–14.5)
WBC: 10.7 10*3/uL (ref 3.6–11.0)

## 2015-12-24 LAB — URINALYSIS COMPLETE WITH MICROSCOPIC (ARMC ONLY)
BILIRUBIN URINE: NEGATIVE
Glucose, UA: NEGATIVE mg/dL
NITRITE: POSITIVE — AB
PH: 5 (ref 5.0–8.0)
Protein, ur: 30 mg/dL — AB
Specific Gravity, Urine: 1.019 (ref 1.005–1.030)

## 2015-12-24 LAB — LIPASE, BLOOD: LIPASE: 23 U/L (ref 11–51)

## 2015-12-24 MED ORDER — SODIUM CHLORIDE 0.9 % IV BOLUS (SEPSIS)
1000.0000 mL | Freq: Once | INTRAVENOUS | Status: AC
  Administered 2015-12-24: 1000 mL via INTRAVENOUS

## 2015-12-24 MED ORDER — GI COCKTAIL ~~LOC~~
30.0000 mL | Freq: Once | ORAL | Status: AC
  Administered 2015-12-24: 30 mL via ORAL
  Filled 2015-12-24: qty 30

## 2015-12-24 MED ORDER — ONDANSETRON HCL 4 MG/2ML IJ SOLN
4.0000 mg | Freq: Once | INTRAMUSCULAR | Status: AC
  Administered 2015-12-24: 4 mg via INTRAVENOUS
  Filled 2015-12-24: qty 2

## 2015-12-24 MED ORDER — DICYCLOMINE HCL 10 MG/ML IM SOLN
20.0000 mg | Freq: Once | INTRAMUSCULAR | Status: AC
  Administered 2015-12-24: 20 mg via INTRAMUSCULAR
  Filled 2015-12-24 (×2): qty 2

## 2015-12-24 MED ORDER — DEXTROSE 5 % IV SOLN
1.0000 g | Freq: Once | INTRAVENOUS | Status: AC
  Administered 2015-12-24: 1 g via INTRAVENOUS
  Filled 2015-12-24: qty 10

## 2015-12-24 MED ORDER — MORPHINE SULFATE (PF) 4 MG/ML IV SOLN
4.0000 mg | Freq: Once | INTRAVENOUS | Status: AC
Start: 2015-12-24 — End: 2015-12-24
  Administered 2015-12-24: 4 mg via INTRAVENOUS
  Filled 2015-12-24: qty 1

## 2015-12-24 NOTE — ED Notes (Signed)
Explained to patient the delay in getting her medication from pharmacy. Pt states understanding. Will administer medication when it arrives from pharmacy.

## 2015-12-24 NOTE — ED Notes (Signed)
This RN went to room to explain delay to patient and to notify patient that pharmacy had been called again about her medication. Pt noted to be sleeping in the bed with eyes closed. Respirations even and unlabored. Will continue to monitor for further patient needs.

## 2015-12-24 NOTE — ED Notes (Signed)
Pt noted to be resting in bed with SO at bedside, NAD noted. Respirations even and unlabored. Will continue to monitor for further patient needs.

## 2015-12-24 NOTE — ED Notes (Signed)
Report given to Felicia, RN

## 2015-12-24 NOTE — ED Notes (Signed)
Pt states she feels the same since she came in, states the GI cocktail did not help with her pain. Continues to c/o generalized abdominal pain at this time. Pt's family remains at bedside at this time. Will continue to monitor. Explained to patient that we would wait for a few more minutes to see if GI cocktail relieved pain.

## 2015-12-24 NOTE — ED Notes (Signed)
Pt calls out and states that she continues to have 8/10 generalized abdominal pain. States she feels no better than she did earlier. Told patient will notify MD.

## 2015-12-24 NOTE — ED Notes (Signed)
Pt noted to be continuing to rest in bed. NAD noted at this time. Respirations even and unlabored. VSS and WNL at this time.

## 2015-12-24 NOTE — ED Notes (Signed)
This RN unhooked patient from monitors so that patient could go to the restroom, instructed patient that we needed a urine sample and pt given a cup.

## 2015-12-24 NOTE — ED Notes (Signed)
Report given to Rebecca, RN

## 2015-12-24 NOTE — ED Provider Notes (Signed)
[facility] Emergency Department Provider Note   ____________________________________________   I have reviewed the triage vital signs and the nursing notes.   HISTORY  Chief Complaint Abdominal Pain   History limited by: Not Limited   HPI Barbara Bates is a 40 y.o. female who presents to the emergency department today with primary concern for abdominal pain. It is located in the epigastric region. It started roughly 3 days ago. It has been constant. It is severe. It is associated with nausea and vomiting. She is vomiting up yellow emesis. She denies any blood or black emesis. Patient denies any fevers. States that she had similar pain a few months ago when she was admitted with pancreatitis. She denies any heavy alcohol use same that she might of had 2 or 3 beers somewhat recently but no alcohol in the past three days. Denies any sick contacts.    Past Medical History:  Diagnosis Date  . Pancreatitis     Patient Active Problem List   Diagnosis Date Noted  . Pancreatitis 09/06/2015    Past Surgical History:  Procedure Laterality Date  . none      Prior to Admission medications   Medication Sig Start Date End Date Taking? Authorizing Provider  oxyCODONE-acetaminophen (PERCOCET) 7.5-325 MG tablet Take 1 tablet by mouth every 4 (four) hours as needed for severe pain. 09/07/15   Wyatt Haste, MD    Allergies Review of patient's allergies indicates no known allergies.  Family History  Problem Relation Age of Onset  . Stroke Mother     Social History Social History  Substance Use Topics  . Smoking status: Current Every Day Smoker    Packs/day: 0.50  . Smokeless tobacco: Never Used  . Alcohol use Yes     Comment: 5-6 beers per day     Review of Systems  Constitutional: Negative for fever. Cardiovascular: Negative for chest pain. Respiratory: Negative for shortness of breath. Gastrointestinal: Positive for epigastric pain, nausea and  vomiting  Neurological: Negative for headaches, focal weakness or numbness.  10-point ROS otherwise negative.  ____________________________________________   PHYSICAL EXAM:  VITAL SIGNS: ED Triage Vitals  Enc Vitals Group     BP 12/24/15 1459 (!) 128/91     Pulse Rate 12/24/15 1459 99     Resp 12/24/15 1459 (!) 22     Temp 12/24/15 1459 98.9 F (37.2 C)     Temp Source 12/24/15 1459 Oral     SpO2 12/24/15 1459 100 %     Weight 12/24/15 1443 135 lb (61.2 kg)     Height 12/24/15 1443 5\' 7"  (1.702 m)     Head Circumference --      Peak Flow --      Pain Score 12/24/15 1443 8   Constitutional: Alert and oriented. Well appearing and in no distress. Eyes: Conjunctivae are normal. Normal extraocular movements. ENT   Head: Normocephalic and atraumatic.   Nose: No congestion/rhinnorhea.   Mouth/Throat: Mucous membranes are moist.   Neck: No stridor. Hematological/Lymphatic/Immunilogical: No cervical lymphadenopathy. Cardiovascular: Normal rate, regular rhythm.  No murmurs, rubs, or gallops. Respiratory: Normal respiratory effort without tachypnea nor retractions. Breath sounds are clear and equal bilaterally. No wheezes/rales/rhonchi. Gastrointestinal: Soft and minimally tender to palpation in the epigastric region, no rebound and no guarding.  Genitourinary: Deferred Musculoskeletal: Normal range of motion in all extremities. No lower extremity edema. Neurologic:  Normal speech and language. No gross focal neurologic deficits are appreciated.  Skin:  Skin  is warm, dry and intact. No rash noted. Psychiatric: Mood and affect are normal. Speech and behavior are normal. Patient exhibits appropriate insight and judgment.  ____________________________________________    LABS (pertinent positives/negatives)  Labs Reviewed  CBC WITH DIFFERENTIAL/PLATELET - Abnormal; Notable for the following:       Result Value   RBC 3.41 (*)    Hemoglobin 11.7 (*)    HCT 34.3 (*)     MCV 100.4 (*)    MCH 34.4 (*)    Neutro Abs 8.7 (*)    All other components within normal limits  COMPREHENSIVE METABOLIC PANEL - Abnormal; Notable for the following:    Sodium 133 (*)    Chloride 94 (*)    Creatinine, Ser 1.16 (*)    Total Protein 8.3 (*)    Albumin 2.8 (*)    ALT 11 (*)    GFR calc non Af Amer 57 (*)    All other components within normal limits  URINALYSIS COMPLETEWITH MICROSCOPIC ([facility] ONLY) - Abnormal; Notable for the following:    Color, Urine AMBER (*)    APPearance CLOUDY (*)    Ketones, ur 1+ (*)    Hgb urine dipstick 1+ (*)    Protein, ur 30 (*)    Nitrite POSITIVE (*)    Leukocytes, UA 1+ (*)    Bacteria, UA MANY (*)    Squamous Epithelial / LPF 6-30 (*)    All other components within normal limits  LIPASE, BLOOD  PREGNANCY, URINE     ____________________________________________   EKG  None  ____________________________________________    RADIOLOGY  None  ____________________________________________   PROCEDURES  Procedures  ____________________________________________   INITIAL IMPRESSION / ASSESSMENT AND PLAN / ED COURSE  Pertinent labs & imaging results that were available during my care of the patient were reviewed by me and considered in my medical decision making (see chart for details).  Patient presented to the emergency department today because of concerns for abdominal pain and concern for pancreatitis. On exam she has some mild tenderness to palpation in the abdomen. No rebound or guarding. Lipase however was within normal limits. Patient's urine was concerning for urinary tract infection. Patient was given dose of IV antibiotics here. Patient did state that the Bentyl didn't help improve some of her pain. Will discharge with antibiotics and Bentyl. ____________________________________________   FINAL CLINICAL IMPRESSION(S) / ED DIAGNOSES  Final diagnoses:  Abdominal pain, unspecified abdominal location  UTI  (lower urinary tract infection)     Note: This dictation was prepared with Dragon dictation. Any transcriptional errors that result from this process are unintentional    Phineas SemenGraydon Dontavion Noxon, MD 12/25/15 0021

## 2015-12-24 NOTE — ED Triage Notes (Signed)
Pt presents to ED with c/o generalized abdominal pain. Pt states she has a hx of pancreatitis and states this pain feels like a flare up. Pt noted to be hold the middle of stomach at this time. Per EMS pt has been unable to eat or drink anything for the last 3 days. And reports emesis x 4 in the last 36 hours.

## 2015-12-25 LAB — PREGNANCY, URINE: PREG TEST UR: NEGATIVE

## 2015-12-25 MED ORDER — CEPHALEXIN 500 MG PO CAPS: 500.0000 mg | ORAL_CAPSULE | Freq: Three times a day (TID) | ORAL | 0 refills | Status: DC

## 2015-12-25 MED ORDER — DICYCLOMINE HCL 20 MG PO TABS: 20.0000 mg | ORAL_TABLET | Freq: Three times a day (TID) | ORAL | 0 refills | Status: DC | PRN

## 2015-12-25 NOTE — Discharge Instructions (Signed)
Please seek medical attention for any high fevers, chest pain, shortness of breath, change in behavior, persistent vomiting, bloody stool or any other new or concerning symptoms.  

## 2017-11-03 ENCOUNTER — Ambulatory Visit
Admission: RE | Admit: 2017-11-03 | Discharge: 2017-11-03 | Disposition: A | Discharge status: Home / Self Care | Payer: Disability Insurance | Attending: General Practice | Admitting: General Practice

## 2017-11-03 ENCOUNTER — Other Ambulatory Visit: Payer: Self-pay | Admitting: General Practice

## 2017-11-03 ENCOUNTER — Ambulatory Visit
Admission: RE | Admit: 2017-11-03 | Discharge: 2017-11-03 | Disposition: A | Discharge status: Home / Self Care | Payer: Disability Insurance | Source: Ambulatory Visit | Attending: General Practice | Admitting: General Practice

## 2017-11-03 DIAGNOSIS — M25561 Pain in right knee: Secondary | ICD-10-CM | POA: Insufficient documentation

## 2017-11-03 DIAGNOSIS — M25462 Effusion, left knee: Secondary | ICD-10-CM

## 2017-11-03 DIAGNOSIS — M7989 Other specified soft tissue disorders: Secondary | ICD-10-CM | POA: Insufficient documentation

## 2017-11-03 DIAGNOSIS — M25562 Pain in left knee: Secondary | ICD-10-CM | POA: Insufficient documentation

## 2017-12-21 ENCOUNTER — Encounter: Payer: Self-pay | Admitting: Emergency Medicine

## 2017-12-21 DIAGNOSIS — F1010 Alcohol abuse, uncomplicated: Secondary | ICD-10-CM | POA: Diagnosis present

## 2017-12-21 DIAGNOSIS — K852 Alcohol induced acute pancreatitis without necrosis or infection: Principal | ICD-10-CM | POA: Diagnosis present

## 2017-12-21 DIAGNOSIS — Z681 Body mass index (BMI) 19 or less, adult: Secondary | ICD-10-CM

## 2017-12-21 DIAGNOSIS — E440 Moderate protein-calorie malnutrition: Secondary | ICD-10-CM | POA: Diagnosis present

## 2017-12-21 DIAGNOSIS — F17210 Nicotine dependence, cigarettes, uncomplicated: Secondary | ICD-10-CM | POA: Diagnosis present

## 2017-12-21 DIAGNOSIS — E876 Hypokalemia: Secondary | ICD-10-CM | POA: Diagnosis present

## 2017-12-21 LAB — COMPREHENSIVE METABOLIC PANEL
ALBUMIN: 3.8 g/dL (ref 3.5–5.0)
ALK PHOS: 65 U/L (ref 38–126)
ALT: 14 U/L (ref 0–44)
AST: 37 U/L (ref 15–41)
Anion gap: 10 (ref 5–15)
BILIRUBIN TOTAL: 0.4 mg/dL (ref 0.3–1.2)
BUN: 8 mg/dL (ref 6–20)
CO2: 24 mmol/L (ref 22–32)
Calcium: 8.8 mg/dL — ABNORMAL LOW (ref 8.9–10.3)
Chloride: 106 mmol/L (ref 98–111)
Creatinine, Ser: 0.76 mg/dL (ref 0.44–1.00)
GFR calc Af Amer: >60 mL/min (ref >60)
GLUCOSE: 120 mg/dL — AB (ref 70–99)
POTASSIUM: 3.4 mmol/L — AB (ref 3.5–5.1)
Sodium: 140 mmol/L (ref 135–145)
TOTAL PROTEIN: 7.7 g/dL (ref 6.5–8.1)

## 2017-12-21 LAB — CBC
HEMATOCRIT: 34.9 % — AB (ref 35.0–47.0)
HEMOGLOBIN: 11.9 g/dL — AB (ref 12.0–16.0)
MCH: 34.5 pg — ABNORMAL HIGH (ref 26.0–34.0)
MCHC: 34.1 g/dL (ref 32.0–36.0)
MCV: 101.1 fL — ABNORMAL HIGH (ref 80.0–100.0)
Platelets: 172 10*3/uL (ref 150–440)
RBC: 3.45 MIL/uL — ABNORMAL LOW (ref 3.80–5.20)
RDW: 14.6 % — ABNORMAL HIGH (ref 11.5–14.5)
WBC: 6.8 10*3/uL (ref 3.6–11.0)

## 2017-12-21 NOTE — ED Notes (Signed)
Pt arrived via EMS from home with c/o abd pain to right upper x 1 hour;history of pancreatitis; admits to 1 beer earlier; 140/90, 87, 16, 99% room air;

## 2017-12-21 NOTE — ED Triage Notes (Signed)
Patient with complaint of upper abdominal pain that started this afternoon. Patient states that she has had nausea and vomiting. Patient states that she has a history of pancreatitis and it feels the same.

## 2017-12-22 ENCOUNTER — Inpatient Hospital Stay: Payer: Self-pay

## 2017-12-22 ENCOUNTER — Inpatient Hospital Stay
Admission: EM | Admit: 2017-12-22 | Discharge: 2017-12-23 | DRG: 439 | Disposition: A | Discharge status: Home / Self Care | Payer: Self-pay | Attending: Specialist | Admitting: Specialist

## 2017-12-22 ENCOUNTER — Other Ambulatory Visit: Payer: Self-pay

## 2017-12-22 DIAGNOSIS — K852 Alcohol induced acute pancreatitis without necrosis or infection: Secondary | ICD-10-CM

## 2017-12-22 DIAGNOSIS — E440 Moderate protein-calorie malnutrition: Secondary | ICD-10-CM

## 2017-12-22 DIAGNOSIS — K5900 Constipation, unspecified: Secondary | ICD-10-CM

## 2017-12-22 DIAGNOSIS — K859 Acute pancreatitis without necrosis or infection, unspecified: Secondary | ICD-10-CM | POA: Diagnosis present

## 2017-12-22 LAB — HEMOGLOBIN A1C
Hgb A1c MFr Bld: 5.4 % (ref 4.8–5.6)
Mean Plasma Glucose: 108.28 mg/dL

## 2017-12-22 LAB — TSH: TSH: 0.807 u[IU]/mL (ref 0.350–4.500)

## 2017-12-22 LAB — PHOSPHORUS: Phosphorus: 3.1 mg/dL (ref 2.5–4.6)

## 2017-12-22 LAB — MAGNESIUM: Magnesium: 1.4 mg/dL — ABNORMAL LOW (ref 1.7–2.4)

## 2017-12-22 LAB — LIPASE, BLOOD: Lipase: 3941 U/L — ABNORMAL HIGH (ref 11–51)

## 2017-12-22 MED ORDER — MORPHINE SULFATE (PF) 4 MG/ML IV SOLN
4.0000 mg | Freq: Once | INTRAVENOUS | Status: AC
  Administered 2017-12-22: 4 mg via INTRAVENOUS

## 2017-12-22 MED ORDER — LORAZEPAM 2 MG/ML IJ SOLN: 1.0000 mg | Freq: Four times a day (QID) | INTRAMUSCULAR | Status: DC | PRN

## 2017-12-22 MED ORDER — ADULT MULTIVITAMIN W/MINERALS CH
1.0000 | ORAL_TABLET | Freq: Every day | ORAL | Status: DC
  Administered 2017-12-22 – 2017-12-23 (×2): 1 via ORAL
  Filled 2017-12-22 (×2): qty 1

## 2017-12-22 MED ORDER — POTASSIUM CHLORIDE IN NACL 20-0.9 MEQ/L-% IV SOLN
INTRAVENOUS | Status: DC
  Administered 2017-12-22 – 2017-12-23 (×4): via INTRAVENOUS
  Filled 2017-12-22 (×7): qty 1000

## 2017-12-22 MED ORDER — SODIUM CHLORIDE 0.9 % IV BOLUS
1000.0000 mL | Freq: Once | INTRAVENOUS | Status: AC
  Administered 2017-12-22: 1000 mL via INTRAVENOUS

## 2017-12-22 MED ORDER — ONDANSETRON HCL 4 MG PO TABS: 4.0000 mg | ORAL_TABLET | Freq: Four times a day (QID) | ORAL | Status: DC | PRN

## 2017-12-22 MED ORDER — ONDANSETRON HCL 4 MG/2ML IJ SOLN
4.0000 mg | Freq: Once | INTRAMUSCULAR | Status: AC
  Administered 2017-12-22: 4 mg via INTRAVENOUS

## 2017-12-22 MED ORDER — ENOXAPARIN SODIUM 40 MG/0.4ML ~~LOC~~ SOLN
40.0000 mg | SUBCUTANEOUS | Status: DC
  Filled 2017-12-22: qty 0.4

## 2017-12-22 MED ORDER — LORAZEPAM 2 MG/ML IJ SOLN: 0.0000 mg | Freq: Four times a day (QID) | INTRAMUSCULAR | Status: DC

## 2017-12-22 MED ORDER — LORAZEPAM 2 MG/ML IJ SOLN: 0.0000 mg | Freq: Two times a day (BID) | INTRAMUSCULAR | Status: DC

## 2017-12-22 MED ORDER — ONDANSETRON HCL 4 MG/2ML IJ SOLN
INTRAMUSCULAR | Status: AC
  Administered 2017-12-22: 4 mg via INTRAVENOUS
  Filled 2017-12-22: qty 2

## 2017-12-22 MED ORDER — BOOST / RESOURCE BREEZE PO LIQD CUSTOM: 1.0000 | Freq: Three times a day (TID) | ORAL | Status: DC

## 2017-12-22 MED ORDER — MORPHINE SULFATE (PF) 2 MG/ML IV SOLN
2.0000 mg | INTRAVENOUS | Status: DC | PRN
  Administered 2017-12-22 – 2017-12-23 (×2): 2 mg via INTRAVENOUS
  Filled 2017-12-22 (×2): qty 1

## 2017-12-22 MED ORDER — MAGNESIUM SULFATE 4 GM/100ML IV SOLN
4.0000 g | Freq: Once | INTRAVENOUS | Status: AC
  Administered 2017-12-22: 4 g via INTRAVENOUS
  Filled 2017-12-22: qty 100

## 2017-12-22 MED ORDER — MORPHINE SULFATE (PF) 4 MG/ML IV SOLN
4.0000 mg | INTRAVENOUS | Status: DC | PRN
  Administered 2017-12-22: 4 mg via INTRAVENOUS
  Filled 2017-12-22: qty 1

## 2017-12-22 MED ORDER — ACETAMINOPHEN 650 MG RE SUPP: 650.0000 mg | Freq: Four times a day (QID) | RECTAL | Status: DC | PRN

## 2017-12-22 MED ORDER — THIAMINE HCL 100 MG/ML IJ SOLN: 100.0000 mg | Freq: Every day | INTRAMUSCULAR | Status: DC

## 2017-12-22 MED ORDER — DOCUSATE SODIUM 100 MG PO CAPS
100.0000 mg | ORAL_CAPSULE | Freq: Two times a day (BID) | ORAL | Status: DC
  Administered 2017-12-22 – 2017-12-23 (×2): 100 mg via ORAL
  Filled 2017-12-22 (×3): qty 1

## 2017-12-22 MED ORDER — VITAMIN B-1 100 MG PO TABS
100.0000 mg | ORAL_TABLET | Freq: Every day | ORAL | Status: DC
  Administered 2017-12-22 – 2017-12-23 (×2): 100 mg via ORAL
  Filled 2017-12-22 (×2): qty 1

## 2017-12-22 MED ORDER — ACETAMINOPHEN 325 MG PO TABS: 650.0000 mg | ORAL_TABLET | Freq: Four times a day (QID) | ORAL | Status: DC | PRN

## 2017-12-22 MED ORDER — LORAZEPAM 1 MG PO TABS: 1.0000 mg | ORAL_TABLET | Freq: Four times a day (QID) | ORAL | Status: DC | PRN

## 2017-12-22 MED ORDER — FOLIC ACID 1 MG PO TABS
1.0000 mg | ORAL_TABLET | Freq: Every day | ORAL | Status: DC
  Administered 2017-12-22 – 2017-12-23 (×2): 1 mg via ORAL
  Filled 2017-12-22 (×2): qty 1

## 2017-12-22 MED ORDER — MORPHINE SULFATE (PF) 4 MG/ML IV SOLN
INTRAVENOUS | Status: AC
  Administered 2017-12-22: 4 mg via INTRAVENOUS
  Filled 2017-12-22: qty 1

## 2017-12-22 MED ORDER — ONDANSETRON HCL 4 MG/2ML IJ SOLN
4.0000 mg | Freq: Four times a day (QID) | INTRAMUSCULAR | Status: DC | PRN
  Administered 2017-12-22 – 2017-12-23 (×2): 4 mg via INTRAVENOUS
  Filled 2017-12-22 (×2): qty 2

## 2017-12-22 NOTE — Progress Notes (Signed)
Initial Nutrition Assessment  DOCUMENTATION CODES:   Non-severe (moderate) malnutrition in context of social or environmental circumstances  INTERVENTION:   Boost Breeze po TID, each supplement provides 250 kcal and 9 grams of protein  MVI, thiamine and folic acid daily in setting of etoh abuse   Pt likely at high refeeding risk; wound recommend monitor K, Mg and P labs as oral intake improves.   NUTRITION DIAGNOSIS:   Moderate Malnutrition related to social / environmental circumstances(etoh abuse ) as evidenced by mild to moderate fat depletions, moderate to severe muscle depletions.  GOAL:   Patient will meet greater than or equal to 90% of their needs  MONITOR:   PO intake, Supplement acceptance, Diet advancement, Labs, Weight trends, Skin, I & O's  REASON FOR ASSESSMENT:   Other (Comment)(Low BMI)    ASSESSMENT:   42 year old female with h/o etoh abuse admitted for pancreatitis.   Met with pt in room today. Pt reports poor appetite and oral intake at baseline. Pt reports that she has hardly had anything to eat over the past 4-5 days r/t abdominal pain. She has mainly been drinking Ginger Ale. Pt denies any nausea or vomiting. Pt reports that she used to weigh 145lbs a few years ago but that she has been slowly losing weight over the past few years. Pt is unsure if she has had any recent wt loss. There is no recent documented weight history in chart. Pt on clear liquid diet reports that she drank some Ginger Ale this morning with no additional abdominal pain. Pt is willing to drink protein supplements to help her meet her estimated needs; RD will order Boost Breeze. Pt likely at high refeeding risk; wound recommend monitor K, Mg and P labs as oral intake improves. Pt with macrocytic anemia; recommend check B12 and folate labs to r/o deficiency. Pt on CIWA and already receiving MVI, thiamine and folic acid.   Medications reviewed and include: colace, lovenox, folic acid, MVI,  thiamine, NaCl w/ KCl @125ml /hr  Labs reviewed: K 3.4(L), lipase 3941(H) Hgb 11.9(L), Hct 34.9(L), MCV 101.1(H), MCH 34.5(H)  NUTRITION - FOCUSED PHYSICAL EXAM:    Most Recent Value  Orbital Region  No depletion  Upper Arm Region  Moderate depletion  Thoracic and Lumbar Region  Unable to assess  Buccal Region  No depletion  Temple Region  No depletion  Clavicle Bone Region  Moderate depletion  Clavicle and Acromion Bone Region  Moderate depletion  Scapular Bone Region  Mild depletion  Dorsal Hand  Mild depletion  Patellar Region  Severe depletion  Anterior Thigh Region  Severe depletion  Posterior Calf Region  Severe depletion  Edema (RD Assessment)  None  Hair  Reviewed  Eyes  Reviewed  Mouth  Reviewed  Skin  Reviewed  Nails  Reviewed     Diet Order:   Diet Order            Diet clear liquid Room service appropriate? Yes; Fluid consistency: Thin  Diet effective now             EDUCATION NEEDS:   Education needs have been addressed  Skin:  Skin Assessment: Reviewed RN Assessment  Last BM:  pta  Height:   Ht Readings from Last 1 Encounters:  12/21/17 5' 7"  (1.702 m)    Weight:   Wt Readings from Last 1 Encounters:  12/21/17 49.9 kg    Ideal Body Weight:  61.36 kg  BMI:  Body mass index is 17.23 kg/m.  Estimated Nutritional Needs:   Kcal:  1600-1800kcal/day   Protein:  65-76g/day   Fluid:  >1.5L/day   Koleen Distance MS, RD, LDN Pager #- [phone] Office#- [phone] After Hours Pager: [phone]

## 2017-12-22 NOTE — H&P (Signed)
Barbara Bates is an 42 y.o. female.   Chief Complaint: Abdominal pain HPI: The patient with past medical history of pancreatitis and alcohol abuse presents to the emergency department complaining of abdominal pain.  She states that her pain began today and that she has had countless episodes of nausea and vomiting.  She denies seeing blood in her emesis.  Laboratory evaluation in the emergency department revealed dramatically elevated lipase significant with pancreatitis which prompted the emergency department staff to call the hospitalist service for admission.  Past Medical History:  Diagnosis Date  . Pancreatitis     Past Surgical History:  Procedure Laterality Date  . none      Family History  Problem Relation Age of Onset  . Stroke Mother    Social History:  reports that she has been smoking. She has been smoking about 0.50 packs per day. She has never used smokeless tobacco. She reports that she drinks alcohol. She reports that she does not use drugs.  Allergies: No Known Allergies  No medications prior to admission.    Results for orders placed or performed during the hospital encounter of 12/22/17 (from the past 48 hour(s))  Lipase, blood     Status: Abnormal   Collection Time: 12/21/17 11:27 PM  Result Value Ref Range   Lipase 3,941 (H) 11 - 51 U/L    Comment: RESULT CONFIRMED BY MANUAL DILUTION.PMH Performed at [facility], [address] [place], [place] [address]   Comprehensive metabolic panel     Status: Abnormal   Collection Time: 12/21/17 11:27 PM  Result Value Ref Range   Sodium 140 135 - 145 mmol/L   Potassium 3.4 (L) 3.5 - 5.1 mmol/L   Chloride 106 98 - 111 mmol/L   CO2 24 22 - 32 mmol/L   Glucose, Bld 120 (H) 70 - 99 mg/dL   BUN 8 6 - 20 mg/dL   Creatinine, Ser 0.76 0.44 - 1.00 mg/dL   Calcium 8.8 (L) 8.9 - 10.3 mg/dL   Total Protein 7.7 6.5 - 8.1 g/dL   Albumin 3.8 3.5 - 5.0 g/dL   AST 37 15 - 41 U/L   ALT 14 0 - 44 U/L   Alkaline  Phosphatase 65 38 - 126 U/L   Total Bilirubin 0.4 0.3 - 1.2 mg/dL   GFR calc non Af Amer >60 >60 mL/min   GFR calc Af Amer >60 >60 mL/min    Comment: (NOTE) The eGFR has been calculated using the CKD EPI equation. This calculation has not been validated in all clinical situations. eGFR's persistently <60 mL/min signify possible Chronic Kidney Disease.    Anion gap 10 5 - 15    Comment: Performed at [facility], [address] [place], [place] [address]  CBC     Status: Abnormal   Collection Time: 12/21/17 11:27 PM  Result Value Ref Range   WBC 6.8 3.6 - 11.0 K/uL   RBC 3.45 (L) 3.80 - 5.20 MIL/uL   Hemoglobin 11.9 (L) 12.0 - 16.0 g/dL   HCT 34.9 (L) 35.0 - 47.0 %   MCV 101.1 (H) 80.0 - 100.0 fL   MCH 34.5 (H) 26.0 - 34.0 pg   MCHC 34.1 32.0 - 36.0 g/dL   RDW 14.6 (H) 11.5 - 14.5 %   Platelets 172 150 - 440 K/uL    Comment: Performed at [facility], [address], [place], [place] [address]  TSH     Status: None   Collection  Time: 12/22/17  6:25 AM  Result Value Ref Range   TSH 0.807 0.350 - 4.500 uIU/mL    Comment: Performed by a 3rd Generation assay with a functional sensitivity of <=0.01 uIU/mL. Performed at [facility], [address] [place], [place] [address]    No results found.  Review of Systems  Constitutional: Negative for chills and fever.  HENT: Negative for sore throat and tinnitus.   Eyes: Negative for blurred vision and redness.  Respiratory: Negative for cough and shortness of breath.   Cardiovascular: Negative for chest pain, palpitations, orthopnea and PND.  Gastrointestinal: Positive for abdominal pain, nausea and vomiting. Negative for diarrhea.  Genitourinary: Negative for dysuria, frequency and urgency.  Musculoskeletal: Negative for joint pain and myalgias.  Skin: Negative for rash.       No lesions  Neurological: Negative for speech change, focal weakness and weakness.  Endo/Heme/Allergies: Does not  bruise/bleed easily.       No temperature intolerance  Psychiatric/Behavioral: Negative for depression and suicidal ideas.    Blood pressure 115/86, pulse 98, temperature 98.4 F (36.9 C), temperature source Oral, resp. rate 16, height 5' 7" (1.702 m), weight 49.9 kg, last menstrual period 12/19/2017, SpO2 98 %. Physical Exam  Vitals reviewed. Constitutional: She is oriented to person, place, and time. She appears well-developed and well-nourished. No distress.  HENT:  Head: Normocephalic and atraumatic.  Mouth/Throat: Oropharynx is clear and moist.  Eyes: Pupils are equal, round, and reactive to light. Conjunctivae and EOM are normal. No scleral icterus.  Neck: Normal range of motion. Neck supple. No JVD present. No tracheal deviation present. No thyromegaly present.  Cardiovascular: Normal rate, regular rhythm and normal heart sounds. Exam reveals no gallop and no friction rub.  No murmur heard. Respiratory: Effort normal and breath sounds normal.  GI: Soft. Bowel sounds are normal. She exhibits no distension. There is no tenderness.  Genitourinary:  Genitourinary Comments: Deferred  Musculoskeletal: Normal range of motion. She exhibits no edema.  Lymphadenopathy:    She has no cervical adenopathy.  Neurological: She is alert and oriented to person, place, and time. No cranial nerve deficit. She exhibits normal muscle tone.  Skin: Skin is warm and dry. No rash noted. No erythema.  Psychiatric: She has a normal mood and affect. Her behavior is normal. Judgment and thought content normal.     Assessment/Plan This is a 42-year-old female admitted for pancreatitis. 1.  Pancreatitis: Bowel rest and aggressive fluid hydration.  Manage severe pain with IV morphine. 2.  Alcohol abuse: I placed the patient on a CIWA scale.  Do not expect withdrawal symptoms for at least 48 hours. 3.  Malnourishment: BMI is 17; encouraged healthy diet and exercise. 4.  DVT prophylaxis: Lovenox 5.  GI  prophylaxis: Pantoprazole The patient is a full code.  Time spent on admission orders and patient care proximally 44 minutes  Diamond,  Michael S, MD 12/22/2017, 7:28 AM   

## 2017-12-22 NOTE — Progress Notes (Signed)
Pharmacy Electrolyte Monitoring Consult:  Pharmacy consulted to assist in monitoring and replacing electrolytes in this 42 y.o. female admitted on 12/22/2017 with Abdominal Pain   Labs:  Sodium (mmol/L)  Date Value  12/21/2017 140  06/24/2012 140   Potassium (mmol/L)  Date Value  12/21/2017 3.4 (L)  06/24/2012 3.8   Calcium (mg/dL)  Date Value  16/10/960409/22/2019 8.8 (L)   Calcium, Total (mg/dL)  Date Value  54/09/811903/26/2014 8.7   Albumin (g/dL)  Date Value  14/78/295609/22/2019 3.8  06/24/2012 3.6    Assessment/Plan: Per RD note patient at risk for refeeding syndrome.  9/22 K - 3.4 and patient receiving a total of 60 mEq potassium per 24 hours in IVF 0.9% NaCl w/ KCl 20 mEq at 125 mL/hr.  Will recheck K in the morning.  9/23 labs for Mg and Phos pending.  Will recheck BMP, Mg, and Phos with morning labs  Barbara Bates, PharmD 12/22/17 @ 1524   Barbara Bates 12/22/2017 3:21 PM

## 2017-12-22 NOTE — Progress Notes (Signed)
[facility] [place] [facility]   PATIENT NAME: Barbara Bates    MR#:  [id number]  DATE OF BIRTH:  [birth year]  SUBJECTIVE:   Patient presented to the hospital due to epigastric abdominal pain and noted to have elevated lipase consistent with acute pancreatitis.  Patient says abdominal pain is improved since yesterday no further vomiting.  Tolerating clear liquids well.  REVIEW OF SYSTEMS:    Review of Systems  Constitutional: Negative for chills and fever.  HENT: Negative for congestion and tinnitus.   Eyes: Negative for blurred vision and double vision.  Respiratory: Negative for cough, shortness of breath and wheezing.   Cardiovascular: Negative for chest pain, orthopnea and PND.  Gastrointestinal: Positive for abdominal pain (epigastric). Negative for diarrhea, nausea and vomiting.  Genitourinary: Negative for dysuria and hematuria.  Neurological: Negative for dizziness, sensory change and focal weakness.  All other systems reviewed and are negative.   Nutrition: Clear Liquids Tolerating Diet: Yes Tolerating PT: Ambulatory  DRUG ALLERGIES:  No Known Allergies  VITALS:  Blood pressure (!) 135/98, pulse 88, temperature 98.6 F (37 C), temperature source Oral, resp. rate 16, height 5\' 7"  (1.702 m), weight 49.9 kg, last menstrual period 12/19/2017, SpO2 100 %.  PHYSICAL EXAMINATION:   Physical Exam  GENERAL:  42 y.o.-year-old patient lying in bed in no acute distress.  EYES: Pupils equal, round, reactive to light and accommodation. No scleral icterus. Extraocular muscles intact.  HEENT: Head atraumatic, normocephalic. Oropharynx and nasopharynx clear.  NECK:  Supple, no jugular venous distention. No thyroid enlargement, no tenderness.  LUNGS: Normal breath sounds bilaterally, no wheezing, rales, rhonchi. No use of accessory muscles of respiration.  CARDIOVASCULAR: S1, S2 normal. No murmurs, rubs, or gallops.  ABDOMEN: Soft, Epigastric, no rebound,  rigidity, nondistended. Bowel sounds present. No organomegaly or mass.  EXTREMITIES: No cyanosis, clubbing or edema b/l.    NEUROLOGIC: Cranial nerves II through XII are intact. No focal Motor or sensory deficits b/l.   PSYCHIATRIC: The patient is alert and oriented x 3.  SKIN: No obvious rash, lesion, or ulcer.    LABORATORY PANEL:   CBC Recent Labs  Lab 12/21/17 2327  WBC 6.8  HGB 11.9*  HCT 34.9*  PLT 172   ------------------------------------------------------------------------------------------------------------------  Chemistries  Recent Labs  Lab 12/21/17 2327  NA 140  K 3.4*  CL 106  CO2 24  GLUCOSE 120*  BUN 8  CREATININE 0.76  CALCIUM 8.8*  AST 37  ALT 14  ALKPHOS 65  BILITOT 0.4   ------------------------------------------------------------------------------------------------------------------  Cardiac Enzymes No results for input(s): TROPONINI in the last 168 hours. ------------------------------------------------------------------------------------------------------------------  RADIOLOGY:  Dg Abd 1 View  Result Date: 12/22/2017 CLINICAL DATA:  Right-sided abdominal pain for several hours EXAM: ABDOMEN - 1 VIEW COMPARISON:  09/06/2015 FINDINGS: Scattered large and small bowel gas is noted. No obstructive changes are seen. No free air is noted. No bony abnormality is seen. IMPRESSION: No acute abnormality. Electronically Signed   By: Alcide CleverMark  Lukens M.D.   On: 12/22/2017 12:08     ASSESSMENT AND PLAN:   42 year old female with past medical history of alcohol abuse, previous history of pancreatitis, resents to the hospital due to abdominal pain nausea and vomiting.  1.  Acute pancreatitis-this is a cause of patient's worsening abdominal pain, nausea and vomiting. - Suspected to be secondary to alcohol abuse.  Patient's LFTs are stable. - Continue supportive care with IV fluids, antiemetics, pain control.  Patient is tolerating a clear liquid diet well  and will advance as tolerated tomorrow. -Follow lipase.  2.  Alcohol abuse- no clinical evidence of alcohol withdrawal. -Continue thiamine, folate.  Continue CIWA protocol.  3.  Hypokalemia-will replace potassium and repeat level in the morning. -Check magnesium level.     All the records are reviewed and case discussed with Care Management/Social Worker. Management plans discussed with the patient, family and they are in agreement.  CODE STATUS: Full code  DVT Prophylaxis: Lovenox  TOTAL TIME TAKING CARE OF THIS PATIENT: 30 minutes.   POSSIBLE D/C IN 1-2 DAYS, DEPENDING ON CLINICAL CONDITION.   Houston Siren M.D on 12/22/2017 at 3:18 PM  Between 7am to 6pm - Pager - [phone]  After 6pm go to [email or web address] - Social research officer, government  [facility]  Office  [phone]  CC: Primary care physician; Patient, No Pcp Per

## 2017-12-22 NOTE — Progress Notes (Signed)
Pharmacy Electrolyte Monitoring Consult:  Pharmacy consulted to assist in monitoring and replacing electrolytes in this 42 y.o. female admitted on 12/22/2017 with Abdominal Pain   Labs:  Sodium (mmol/L)  Date Value  12/21/2017 140  06/24/2012 140   Potassium (mmol/L)  Date Value  12/21/2017 3.4 (L)  06/24/2012 3.8   Magnesium (mg/dL)  Date Value  47/82/956209/23/2019 1.4 (L)   Phosphorus (mg/dL)  Date Value  13/08/657809/23/2019 3.1   Calcium (mg/dL)  Date Value  46/96/295209/22/2019 8.8 (L)   Calcium, Total (mg/dL)  Date Value  84/13/244003/26/2014 8.7   Albumin (g/dL)  Date Value  10/27/253609/22/2019 3.8  06/24/2012 3.6    Assessment/Plan: Per RD note patient at risk for refeeding syndrome.  9/23  Phos: 3.1, Mg: 1.4. Will order Magnesium 4g IV x 1 dose.   Will recheck BMP, Mg, and Phos with morning labs. Pharmacy will continue to replace electrolytes as needed.   Gardner CandleSheema M Kahli Fitzgerald, PharmD, BCPS Clinical Pharmacist 12/22/2017 4:06 PM

## 2017-12-23 LAB — URINALYSIS, ROUTINE W REFLEX MICROSCOPIC
BILIRUBIN URINE: NEGATIVE
Glucose, UA: NEGATIVE mg/dL
KETONES UR: 5 mg/dL — AB
LEUKOCYTES UA: NEGATIVE
NITRITE: NEGATIVE
PH: 5 (ref 5.0–8.0)
Protein, ur: NEGATIVE mg/dL
Specific Gravity, Urine: 1.012 (ref 1.005–1.030)

## 2017-12-23 LAB — PHOSPHORUS: PHOSPHORUS: 2.4 mg/dL — AB (ref 2.5–4.6)

## 2017-12-23 LAB — BASIC METABOLIC PANEL
Anion gap: 5 (ref 5–15)
BUN: 7 mg/dL (ref 6–20)
CHLORIDE: 107 mmol/L (ref 98–111)
CO2: 23 mmol/L (ref 22–32)
CREATININE: 0.6 mg/dL (ref 0.44–1.00)
Calcium: 7.7 mg/dL — ABNORMAL LOW (ref 8.9–10.3)
GFR calc Af Amer: >60 mL/min (ref >60)
GFR calc non Af Amer: >60 mL/min (ref >60)
Glucose, Bld: 71 mg/dL (ref 70–99)
POTASSIUM: 4.2 mmol/L (ref 3.5–5.1)
SODIUM: 135 mmol/L (ref 135–145)

## 2017-12-23 LAB — MAGNESIUM: MAGNESIUM: 2.3 mg/dL (ref 1.7–2.4)

## 2017-12-23 LAB — HIV ANTIBODY (ROUTINE TESTING W REFLEX): HIV Screen 4th Generation wRfx: NONREACTIVE

## 2017-12-23 LAB — LIPASE, BLOOD: Lipase: 148 U/L — ABNORMAL HIGH (ref 11–51)

## 2017-12-23 MED ORDER — K PHOS MONO-SOD PHOS DI & MONO 155-852-130 MG PO TABS
500.0000 mg | ORAL_TABLET | ORAL | Status: AC
  Administered 2017-12-23 (×2): 500 mg via ORAL
  Filled 2017-12-23 (×2): qty 2

## 2017-12-23 MED ORDER — ENSURE ENLIVE PO LIQD
237.0000 mL | Freq: Two times a day (BID) | ORAL | Status: DC
  Administered 2017-12-23 (×2): 237 mL via ORAL

## 2017-12-23 NOTE — Discharge Summary (Signed)
[facility] - [place] at [facility]   PATIENT NAME: Barbara Bates    MR#:  [id number]  DATE OF BIRTH:  [birth year]  DATE OF ADMISSION:  12/22/2017 ADMITTING PHYSICIAN: Arnaldo Natal, MD  DATE OF DISCHARGE: 12/23/2017  2:35 PM  PRIMARY CARE PHYSICIAN: Barbara Bates    ADMISSION DIAGNOSIS:  Alcohol-induced acute pancreatitis, unspecified complication status [K85.20]  DISCHARGE DIAGNOSIS:  Active Problems:   Pancreatitis   Malnutrition of moderate degree   SECONDARY DIAGNOSIS:   Past Medical History:  Diagnosis Date  . Pancreatitis     HOSPITAL COURSE:   42 year old female with past medical history of alcohol abuse, previous history of pancreatitis, resents to the hospital due to abdominal pain nausea and vomiting.  1.  Acute pancreatitis-this was the cause of patient's worsening abdominal pain, nausea and vomiting. - Suspected to be secondary to alcohol abuse.  Patient's LFTs were stable. -Patient was treated supportively with IV fluids antiemetics and pain control.  Patient's lipase has improved, her pain has also improved, her diet has been slowly advanced from a clear liquid to regular diet which she is tolerating without any further nausea vomiting abdominal pain.  She is therefore being discharged home.  She was strongly advised to abstain from alcohol.  2.  Alcohol abuse- no clinical evidence of alcohol withdrawal. -Patient was placed on CIWA protocol while in the hospital but had no evidence of withdrawal or DTs.  3.  Hypokalemia- has improved and resolved with supplementation.  Patient's magnesium level is normal.  DISCHARGE CONDITIONS:   Stable  CONSULTS OBTAINED:    DRUG ALLERGIES:  No Known Allergies  DISCHARGE MEDICATIONS:   Allergies as of 12/23/2017   No Known Allergies     Medication List    You have not been prescribed any medications.       DISCHARGE INSTRUCTIONS:   DIET:  Regular diet  DISCHARGE CONDITION:   Stable  ACTIVITY:  Activity as tolerated  OXYGEN:  Home Oxygen: No.   Oxygen Delivery: room air  DISCHARGE LOCATION:  home   If you experience worsening of your admission symptoms, develop shortness of breath, life threatening emergency, suicidal or homicidal thoughts you must seek medical attention immediately by calling 911 or calling your MD immediately  if symptoms less severe.  You Must read complete instructions/literature along with all the possible adverse reactions/side effects for all the Medicines you take and that have been prescribed to you. Take any new Medicines after you have completely understood and accpet all the possible adverse reactions/side effects.   Please note  You were cared for by a hospitalist during your hospital stay. If you have any questions about your discharge medications or the care you received while you were in the hospital after you are discharged, you can call the unit and asked to speak with the hospitalist on call if the hospitalist that took care of you is not available. Once you are discharged, your primary care physician will handle any further medical issues. Please note that NO REFILLS for any discharge medications will be authorized once you are discharged, as it is imperative that you return to your primary care physician (or establish a relationship with a primary care physician if you do not have one) for your aftercare needs so that they can reassess your need for medications and monitor your lab values.     Today   No abdominal pain, nausea vomiting is improved.  Patient is tolerating p.o. well.  Says she thought she may have a UTI, but urinalysis checked today is negative for any infection.  VITAL SIGNS:  Blood pressure (!) 128/92, pulse 77, temperature 98.2 F (36.8 C), temperature source Oral, resp. rate 16, height 5\' 7"  (1.702 m), weight 60.2 kg, last menstrual period 12/19/2017, SpO2 99 %.  I/O:    Intake/Output Summary  (Last 24 hours) at 12/23/2017 1604 Last data filed at 12/23/2017 1104 Gross Bates 24 hour  Intake 2176.51 ml  Output 50 ml  Net 2126.51 ml    PHYSICAL EXAMINATION:  GENERAL:  42 y.o.-year-old patient lying in the bed with no acute distress.  EYES: Pupils equal, round, reactive to light and accommodation. No scleral icterus. Extraocular muscles intact.  HEENT: Head atraumatic, normocephalic. Oropharynx and nasopharynx clear.  NECK:  Supple, no jugular venous distention. No thyroid enlargement, no tenderness.  LUNGS: Normal breath sounds bilaterally, no wheezing, rales,rhonchi. No use of accessory muscles of respiration.  CARDIOVASCULAR: S1, S2 normal. No murmurs, rubs, or gallops.  ABDOMEN: Soft, non-tender, non-distended. Bowel sounds present. No organomegaly or mass.  EXTREMITIES: No pedal edema, cyanosis, or clubbing.  NEUROLOGIC: Cranial nerves II through XII are intact. No focal motor or sensory defecits b/l.  PSYCHIATRIC: The patient is alert and oriented x 3.   SKIN: No obvious rash, lesion, or ulcer.   DATA REVIEW:   CBC Recent Labs  Lab 12/21/17 2327  WBC 6.8  HGB 11.9*  HCT 34.9*  PLT 172    Chemistries  Recent Labs  Lab 12/21/17 2327  12/23/17 0505  NA 140  --  135  K 3.4*  --  4.2  CL 106  --  107  CO2 24  --  23  GLUCOSE 120*  --  71  BUN 8  --  7  CREATININE 0.76  --  0.60  CALCIUM 8.8*  --  7.7*  MG  --    < > 2.3  AST 37  --   --   ALT 14  --   --   ALKPHOS 65  --   --   BILITOT 0.4  --   --    < > = values in this interval not displayed.    Cardiac Enzymes No results for input(s): TROPONINI in the last 168 hours.  Microbiology Results  No results found for this or any previous visit.  RADIOLOGY:  Dg Abd 1 View  Result Date: 12/22/2017 CLINICAL DATA:  Right-sided abdominal pain for several hours EXAM: ABDOMEN - 1 VIEW COMPARISON:  09/06/2015 FINDINGS: Scattered large and small bowel gas is noted. No obstructive changes are seen. No free air is  noted. No bony abnormality is seen. IMPRESSION: No acute abnormality. Electronically Signed   By: Alcide CleverMark  Lukens M.D.   On: 12/22/2017 12:08      Management plans discussed with the patient, family and they are in agreement.  CODE STATUS:     Code Status Orders  (From admission, onward)         Start     Ordered   12/22/17 0451  Full code  Continuous     12/22/17 0450        Code Status History    Date Active Date Inactive Code Status Order ID Comments User Context   09/06/2015 0536 09/08/2015 1419 Full Code [id number]  Arnaldo Nataliamond, Michael S, MD Inpatient      TOTAL TIME TAKING CARE OF THIS PATIENT: 40 minutes.    [place] SirenSAINANI,Mykenna Viele J M.D on 12/23/2017 at  4:04 PM  Between 7am to 6pm - Pager - [phone]  After 6pm go to [email or web address] - Social research officer, government  [facility]  Office  [phone]  CC: Primary care physician; Barbara Bates

## 2017-12-23 NOTE — Progress Notes (Signed)
Discharge instructions reviewed with patient.  Understanding was verbalized and all questions were answered.  IV removed without complication; patient tolerated well.  Patient discharged home via wheelchair in stable condition escorted by volunteer staff.

## 2017-12-23 NOTE — Progress Notes (Signed)
Pharmacy Electrolyte Monitoring Consult:  Pharmacy consulted to assist in monitoring and replacing electrolytes in this 42 y.o. female admitted on 12/22/2017 with Abdominal Pain   Labs:  Sodium (mmol/L)  Date Value  12/23/2017 135  06/24/2012 140   Potassium (mmol/L)  Date Value  12/23/2017 4.2  06/24/2012 3.8   Magnesium (mg/dL)  Date Value  40/98/119109/24/2019 2.3   Phosphorus (mg/dL)  Date Value  47/82/956209/24/2019 2.4 (L)   Calcium (mg/dL)  Date Value  13/08/657809/24/2019 7.7 (L)   Calcium, Total (mg/dL)  Date Value  46/96/295203/26/2014 8.7   Albumin (g/dL)  Date Value  84/13/244009/22/2019 3.8  06/24/2012 3.6    Assessment/Plan: Per RD note patient at risk for refeeding syndrome.  9/24 Phos: 2.4 Will order potassium phosphate neutral tablets 2 tablets every 4 hours for 2 doses.  Will recheck BMP, Mg, and Phos with morning labs. Pharmacy will continue to replace electrolytes as needed.   Orinda Kennerhris A Maryem Shuffler, PharmD Clinical Pharmacist 12/23/2017 8:09 AM

## 2017-12-26 ENCOUNTER — Telehealth: Payer: Self-pay

## 2017-12-26 NOTE — Telephone Encounter (Signed)
EMMI Follow-up: Noted on the report that the patient responded she did not have a follow-up appointment.  I reviewed the AVS and no follow-up appointment was needed.  I tried calling Ms. Cressy but her voice mailbox was full.

## 2017-12-26 NOTE — ED Provider Notes (Signed)
[facility] Emergency Department Provider Note   First MD Initiated Contact with Patient 12/22/17 [phone]     (approximate)  I have reviewed the triage vital signs and the nursing notes.   HISTORY  Chief Complaint Abdominal Pain    HPI Barbara Bates is a 42 y.o. female with history of alcohol abuse and pancreatitis presents to the emergency department with complaint of 10 out of 10 epigastric abdominal pain which patient states started today.  Patient also admits to multiple episodes of nonbloody emesis.  She denies any diarrhea or constipation.  Patient denies any urinary symptoms.  Patient states that she had one beer yesterday.  She denies any fever   Past Medical History:  Diagnosis Date  . Pancreatitis     Patient Active Problem List   Diagnosis Date Noted  . Malnutrition of moderate degree 12/22/2017  . Pancreatitis 09/06/2015    Past Surgical History:  Procedure Laterality Date  . none      Prior to Admission medications   Not on File    Allergies Patient has no known allergies.  Family History  Problem Relation Age of Onset  . Stroke Mother     Social History Social History   Tobacco Use  . Smoking status: Current Every Day Smoker    Packs/day: 0.50  . Smokeless tobacco: Never Used  Substance Use Topics  . Alcohol use: Yes    Comment: 3-4 beers daily  . Drug use: No    Review of Systems Constitutional: No fever/chills Eyes: No visual changes. ENT: No sore throat. Cardiovascular: Denies chest pain. Respiratory: Denies shortness of breath. Gastrointestinal: Positive for abdominal pain nausea and vomiting Genitourinary: Negative for dysuria. Musculoskeletal: Negative for neck pain.  Negative for back pain. Integumentary: Negative for rash. Neurological: Negative for headaches, focal weakness or numbness.   ____________________________________________   PHYSICAL EXAM:  VITAL SIGNS: ED Triage Vitals  Enc Vitals  Group     BP 12/21/17 2303 130/83     Pulse Rate 12/21/17 2303 93     Resp 12/21/17 2303 19     Temp 12/21/17 2303 98.4 F (36.9 C)     Temp Source 12/21/17 2303 Oral     SpO2 12/21/17 2303 100 %     Weight 12/21/17 2303 49.9 kg (110 lb)     Height 12/21/17 2303 1.702 m (5\' 7" )     Head Circumference --      Peak Flow --      Pain Score 12/21/17 2315 10     Pain Loc --      Pain Edu? --      Excl. in GC? --     Constitutional: Alert and oriented.  Apparent discomfort Eyes: Conjunctivae are normal.  Head: Atraumatic. Mouth/Throat: Mucous membranes are moist. Oropharynx non-erythematous. Neck: No stridor.  Cardiovascular: Normal rate, regular rhythm. Good peripheral circulation. Grossly normal heart sounds. Respiratory: Normal respiratory effort.  No retractions. Lungs CTAB. Gastrointestinal: Epigastric tenderness to palpation. No distention.  Musculoskeletal: No lower extremity tenderness nor edema. No gross deformities of extremities. Neurologic:  Normal speech and language. No gross focal neurologic deficits are appreciated.  Skin:  Skin is warm, dry and intact. No rash noted.   ____________________________________________   LABS (all labs ordered are listed, but only abnormal results are displayed)  Labs Reviewed  LIPASE, BLOOD - Abnormal; Notable for the following components:      Result Value   Lipase 3,941 (*)    All  other components within normal limits  COMPREHENSIVE METABOLIC PANEL - Abnormal; Notable for the following components:   Potassium 3.4 (*)    Glucose, Bld 120 (*)    Calcium 8.8 (*)    All other components within normal limits  CBC - Abnormal; Notable for the following components:   RBC 3.45 (*)    Hemoglobin 11.9 (*)    HCT 34.9 (*)    MCV 101.1 (*)    MCH 34.5 (*)    RDW 14.6 (*)    All other components within normal limits  MAGNESIUM - Abnormal; Notable for the following components:   Magnesium 1.4 (*)    All other components within normal  limits  LIPASE, BLOOD - Abnormal; Notable for the following components:   Lipase 148 (*)    All other components within normal limits  BASIC METABOLIC PANEL - Abnormal; Notable for the following components:   Calcium 7.7 (*)    All other components within normal limits  PHOSPHORUS - Abnormal; Notable for the following components:   Phosphorus 2.4 (*)    All other components within normal limits  URINALYSIS, ROUTINE W REFLEX MICROSCOPIC - Abnormal; Notable for the following components:   Color, Urine YELLOW (*)    APPearance CLEAR (*)    Hgb urine dipstick MODERATE (*)    Ketones, ur 5 (*)    Bacteria, UA FEW (*)    All other components within normal limits  TSH  HIV ANTIBODY (ROUTINE TESTING W REFLEX)  HEMOGLOBIN A1C  PHOSPHORUS  MAGNESIUM   ____________________________________________  EKG  ED ECG REPORT I, [place] BROWN, the attending physician, personally viewed and interpreted this ECG.   Date: 12/22/2017  EKG Time: 11:22 PM  Rate: 85  Rhythm: Normal sinus rhythm  Axis: Normal  Intervals: Normal  ST&T Change: None  ____________________________________________    Procedures   ____________________________________________   INITIAL IMPRESSION / ASSESSMENT AND PLAN / ED COURSE  As part of my medical decision making, I reviewed the following data within the electronic MEDICAL RECORD NUMBER   42 year old female presenting with above-stated history and physical exam concerning for alcohol induced hepatitis.  Laboratory data notable for lipase of 3941.  Patient given IV morphine and Zofran the emergency department pain improvement as well as nausea however pain persists.  ____________________________________________  FINAL CLINICAL IMPRESSION(S) / ED DIAGNOSES  Final diagnoses:  Alcohol-induced acute pancreatitis, unspecified complication status     MEDICATIONS GIVEN DURING THIS VISIT:  Medications  morphine 4 MG/ML injection 4 mg (4 mg Intravenous Given  12/22/17 0231)  ondansetron (ZOFRAN) injection 4 mg (4 mg Intravenous Given 12/22/17 0231)  sodium chloride 0.9 % bolus 1,000 mL (0 mLs Intravenous Stopped 12/22/17 0300)  magnesium sulfate IVPB 4 g 100 mL ( Intravenous Stopped 12/22/17 1832)  phosphorus (K PHOS NEUTRAL) tablet 500 mg (500 mg Oral Given 12/23/17 1314)     ED Discharge Orders         Ordered    Activity as tolerated - No restrictions     12/23/17 1009    Diet general     12/23/17 1009           Note:  This document was prepared using Dragon voice recognition software and may include unintentional dictation errors.    Darci Current, MD 12/26/17 [phone]

## 2017-12-29 ENCOUNTER — Telehealth: Payer: Self-pay

## 2017-12-29 NOTE — Telephone Encounter (Signed)
Flagged on EMMI report for not having follow up and having other questions.  Called and spoke with patient.  She reported she was not told that she needed a follow up and that she was not given anything for pain when she left the hospital.  Patient doesn't have insurance other than [place] Medicaid Family Planning. Went over [employer] and eligibility requirements and patient interested in receiving application.  Confirmed address to send to.  Apologized that she was not given anything for pain when she left, however made her aware that the hospitalist would not write for anything once she was discharged and that she would need to follow up with a PCP.  Informed her that [facility] only had a few they could offer for pain due to not writing for pain medications.  No other questions or concerns currently.  I thanked her for her time.

## 2019-09-29 ENCOUNTER — Encounter: Payer: Self-pay | Admitting: Family Medicine

## 2019-09-29 ENCOUNTER — Ambulatory Visit: Payer: Self-pay | Admitting: Family Medicine

## 2019-09-29 ENCOUNTER — Other Ambulatory Visit: Payer: Self-pay

## 2019-09-29 DIAGNOSIS — A599 Trichomoniasis, unspecified: Secondary | ICD-10-CM

## 2019-09-29 DIAGNOSIS — Z113 Encounter for screening for infections with a predominantly sexual mode of transmission: Secondary | ICD-10-CM

## 2019-09-29 LAB — WET PREP FOR TRICH, YEAST, CLUE
Trichomonas Exam: POSITIVE — AB
Yeast Exam: NEGATIVE

## 2019-09-29 MED ORDER — METRONIDAZOLE 500 MG PO TABS: 2000.0000 mg | ORAL_TABLET | Freq: Once | ORAL | 0 refills | Status: AC

## 2019-09-29 NOTE — Progress Notes (Signed)
Pt here for STD screening. 

## 2019-09-29 NOTE — Progress Notes (Signed)
[facility] Department STI clinic/screening visit  Subjective:  Barbara Bates is a 44 y.o. female being seen today for an STI screening visit. The patient reports they do have symptoms.  Patient reports that they do not desire a pregnancy in the next year.   They reported they are not interested in discussing contraception today.  No LMP recorded. (Menstrual status: Irregular Periods).   Patient has the following medical conditions:   Patient Active Problem List   Diagnosis Date Noted  . Malnutrition of moderate degree 12/22/2017  . Pancreatitis 09/06/2015    Chief Complaint  Patient presents with  . 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  She is concerned that the rash on her feet are syphilis.  She has been with same partner x 6 years and they've not been sexually active x 8 months.  She denies other STD symptoms.  Client states that she has been treated this past year for pancreatitis due to heavy alcohol intake. She states that she hasn't had alcohol x 2 months.  She has also had uncontrolled HTN and moderate swelling in her feet/ankles.  Her medication were changed by PCP to regulate her HTN and has decreased the swelling in her feet/ankles.     Last HIV test per patient/review of record was unknown Patient reports last pap was unbknown.   See flowsheet for further details and programmatic requirements.    The following portions of the patient's history were reviewed and updated as appropriate: allergies, current medications, past medical history, past social history, past surgical history and problem list.  Objective:  There were no vitals filed for this visit.  Physical Exam Vitals and nursing note reviewed.  Constitutional:      Appearance: She is obese.   HENT:     Head: Normocephalic and atraumatic.     Mouth/Throat:     Mouth: Mucous membranes are moist.     Pharynx: Oropharynx is clear. No oropharyngeal exudate or posterior oropharyngeal erythema.  Pulmonary:     Effort: Pulmonary effort is normal.  Abdominal:     General: Abdomen is flat.     Palpations: There is no mass.     Tenderness: There is no abdominal tenderness. There is no rebound.  Genitourinary:    General: Normal vulva.     Exam position: Lithotomy position.     Pubic Area: No rash or pubic lice.      Labia:        Right: No rash or lesion.        Left: No rash or lesion.      Vagina: Vaginal discharge present. No erythema, bleeding or lesions.     Cervix: No cervical motion tenderness, discharge, friability, lesion or erythema.     Uterus: Normal.      Adnexa: Right adnexa normal and left adnexa normal.     Rectum: Normal.     Comments: Thick white frothy discharge, pH > 4.5 Bimanual: -CMT, uterus/ovaries normal. Musculoskeletal:     Cervical back: Neck supple. No tenderness.  Lymphadenopathy:     Head:     Right side of head: No preauricular or posterior auricular adenopathy.     Left side of head: No preauricular or posterior auricular  adenopathy.     Cervical: No cervical adenopathy.     Upper Body:     Right upper body: No supraclavicular or axillary adenopathy.     Left upper body: No supraclavicular or axillary adenopathy.     Lower Body: No right inguinal adenopathy. No left inguinal adenopathy.  Skin:    General: Skin is warm and dry.     Findings: Lesion present. No rash.     Comments: R/L feet- scaling skin, withseveral hyperpigmented lesions on anterior/medial aspects of feet, none noted on soles of feet. No rash/lesions noted in her mouth, extremities, trunk  Neurological:     Mental Status: She is alert and oriented to person, place, and time.    Assessment and Plan:  Barbara Bates is a 44 y.o. female presenting to the [facility] Department for STI screening  1. Screening examination for venereal disease  - HIV [place] [facility] - HBV Antigen/Antibody [facility] - Syphilis Serology, [place] [facility] - Chlamydia/Gonorrhea [place] [facility] - WET PREP FOR TRICH, YEAST, CLUE - [employer] PA, evaluated the client's rash on feet-findings  not typical for syphilis and will wait for RPR results.  -Co. Client that she will be notified if lab tests are positive.  2. Trichimoniasis  - metroNIDAZOLE (FLAGYL) 500 MG tablet; Take 4 tablets (2,000 mg total) by mouth once for 1 dose.  Dispense: 4 tablet; Refill: 0 - Co to notify partner for treatment. Co not to be sexually active x 1 week. Condoms always.    Return in about 3 months (around 12/30/2019) for TOC.  No future appointments.  Larene Pickett, FNP

## 2019-09-29 NOTE — Progress Notes (Signed)
Wet mount reviewed by provider; per verbal by Larene Pickett, FNP, pt treated for Trich only per standing order. Provider orders completed.

## 2019-11-12 LAB — HM HEPATITIS C SCREENING LAB: HM Hepatitis Screen: NEGATIVE

## 2019-11-12 LAB — HM HIV SCREENING LAB: HM HIV Screening: NEGATIVE

## 2019-11-15 ENCOUNTER — Ambulatory Visit: Payer: Medicaid Other | Attending: Internal Medicine

## 2019-11-15 ENCOUNTER — Encounter: Payer: Self-pay | Admitting: Family Medicine

## 2019-11-15 DIAGNOSIS — Z23 Encounter for immunization: Secondary | ICD-10-CM

## 2019-11-15 NOTE — Progress Notes (Signed)
   Covid-19 Vaccination Clinic  Name:  Barbara Bates    MRN: [id number] DOB: [birth year]  11/15/2019  [name] was observed post Covid-19 immunization for 15 minutes without incident. She was provided with Vaccine Information Sheet and instruction to access the V-Safe system.   [name] was instructed to call 911 with any severe reactions post vaccine: Marland Kitchen Difficulty breathing  . Swelling of face and throat  . A fast heartbeat  . A bad rash all over body  . Dizziness and weakness   Immunizations Administered    Name Date Dose VIS Date Route   Pfizer COVID-19 Vaccine 11/15/2019  2:54 PM 0.3 mL 05/26/2018 Intramuscular   Manufacturer: [employer], [employer]   Lot: [id number]   NDC: [address]-6

## 2019-12-13 ENCOUNTER — Ambulatory Visit: Payer: Medicaid Other

## 2019-12-21 NOTE — Addendum Note (Signed)
Addended by: Larene Pickett on: 12/21/2019 09:13 AM   Modules accepted: Orders

## 2019-12-23 NOTE — Addendum Note (Signed)
Addended by: Heywood Bene on: 12/23/2019 11:18 AM   Modules accepted: Orders

## 2020-11-15 ENCOUNTER — Encounter: Payer: Self-pay | Admitting: *Deleted

## 2020-11-15 ENCOUNTER — Ambulatory Visit: Payer: Medicaid Other | Attending: Oncology | Admitting: *Deleted

## 2020-11-15 ENCOUNTER — Ambulatory Visit
Admission: RE | Admit: 2020-11-15 | Discharge: 2020-11-15 | Disposition: A | Discharge status: Home / Self Care | Payer: Self-pay | Source: Ambulatory Visit | Attending: Oncology | Admitting: Oncology

## 2020-11-15 ENCOUNTER — Other Ambulatory Visit: Payer: Self-pay

## 2020-11-15 VITALS — BP 124/90 | HR 113 | Temp 97.5°F | Ht 66.0 in | Wt 126.9 lb

## 2020-11-15 DIAGNOSIS — Z0000 Encounter for general adult medical examination without abnormal findings: Secondary | ICD-10-CM

## 2020-11-15 NOTE — Patient Instructions (Signed)
Gave patient hand-out, Women Staying Healthy, Active and Well from [facility], with education on breast health, pap smears, heart and colon health. 

## 2020-11-15 NOTE — Progress Notes (Signed)
Subjective:     Patient ID: Barbara Bates, female   DOB: 1976-02-08, 45 y.o.   MRN: [id number]  HPI  [facility] Medical History Record - 11/15/20 1050       Breast History   Screening cycle New    Provider ([facility]) Phineas Real    Initial Mammogram 11/15/20    Last Mammogram Never    Recent Breast Symptoms None      Breast Cancer History   Breast Cancer History No personal or family history      Previous History of Breast Problems   Breast Surgery or Biopsy None    Breast Implants N/A    BSE Done Monthly      Gynecological/Obstetrical History   LMP --   >2 years   Is there any chance that the client could be pregnant?  No    Age at menarche 44    Age at menopause 2    PAP smear history --   > 5 years   Provider (PAP) [facility]    Age at first live birth Nullliparous    DES Exposure No    Cervical, Uterine or Ovarian cancer No    Family history of Cervial, Uterine or Ovarian cancer No    Hysterectomy No    Cervix removed No    Ovaries removed No    Laser/Cryosurgery No    Current method of birth control None    Current method of Estrogen/Hormone replacement None    Smoking history Yes   < 1 ppd              Review of Systems     Objective:   Physical Exam Chest:  Breasts:    Right: No swelling, bleeding, inverted nipple, mass, nipple discharge, skin change or tenderness.     Left: No swelling, bleeding, inverted nipple, mass, nipple discharge, skin change or tenderness.  Abdominal:     Palpations: There is no hepatomegaly or splenomegaly.  Genitourinary:    Labia:        Right: No rash, tenderness, lesion or injury.        Left: No rash, tenderness, lesion or injury.      Urethra: No prolapse or urethral pain.     Vagina: No signs of injury and foreign body. No vaginal discharge, erythema, tenderness, bleeding, lesions or prolapsed vaginal walls.     Cervix: No cervical motion tenderness, discharge, friability, lesion, erythema, cervical bleeding or eversion.      Uterus: Not deviated and not enlarged.      Adnexa:        Right: No mass, tenderness or fullness.         Left: No mass, tenderness or fullness.       Rectum: No mass.  Lymphadenopathy:     Upper Body:     Right upper body: No supraclavicular or axillary adenopathy.     Left upper body: No supraclavicular or axillary adenopathy.      Assessment:     45 year old female referred to [facility] for clinical breast exam and baseline mammogram by the [facility] Department.  On clinical breast exam there is no dominant mass, skin changes, nipple discharge or lymphadenopathy. Taught self breast awareness.  Specimen collected for pap smear without difficulty.  Patient has been screened for eligibility.  She does not have any insurance, Medicare or Medicaid.  She also meets financial eligibility.   Risk Assessment     Risk Scores  11/15/2020   Last edited by: Scarlett Presto, RN   5-year risk: 0.8 %   Lifetime risk: 7 %               Plan:     Screening mammogram ordered.  Specimen for pap sent to the lab.  Will follow up per [facility] protocol.

## 2020-11-17 LAB — IGP, APTIMA HPV: HPV Aptima: NEGATIVE

## 2020-11-20 ENCOUNTER — Encounter: Payer: Self-pay | Admitting: *Deleted

## 2020-11-20 NOTE — Progress Notes (Signed)
Letter mailed to inform patient of her normal mammogram and pap smear.  Next mammo in 1 year and pap smear in 5 years. 

## 2021-02-15 ENCOUNTER — Other Ambulatory Visit: Payer: Self-pay

## 2021-02-15 ENCOUNTER — Emergency Department: Payer: Medicaid Other

## 2021-02-15 ENCOUNTER — Emergency Department
Admission: EM | Admit: 2021-02-15 | Discharge: 2021-02-15 | Disposition: A | Discharge status: Home / Self Care | Payer: Medicaid Other | Attending: Emergency Medicine | Admitting: Emergency Medicine

## 2021-02-15 DIAGNOSIS — F17210 Nicotine dependence, cigarettes, uncomplicated: Secondary | ICD-10-CM | POA: Insufficient documentation

## 2021-02-15 DIAGNOSIS — R600 Localized edema: Secondary | ICD-10-CM | POA: Insufficient documentation

## 2021-02-15 NOTE — ED Provider Notes (Signed)
[facility] Emergency Department Provider Note   ____________________________________________   Event Date/Time   First MD Initiated Contact with Patient 02/15/21 1147     (approximate)  I have reviewed the triage vital signs and the nursing notes.   HISTORY  Chief Complaint Leg Swelling    HPI Barbara Bates is a 45 y.o. female who presents for bilateral lower extremity edema  LOCATION: Bilateral lower extremities DURATION: 3-4 weeks prior to arrival TIMING: Occurs every day while she is at work SEVERITY: Moderate QUALITY: Edema CONTEXT: Patient states that she works 8 hours a day standing in a [employer] and after work each day has bilateral lower extremity edema that causes pressure-like pain MODIFYING FACTORS: This edema is relieved when sitting down and elevating legs and worsened during work when she is standing and not moving ASSOCIATED SYMPTOMS: Bilateral lower extremity pain   Per medical record review, patient has history of pancreatitis          Past Medical History:  Diagnosis Date   History of syphilis    Pancreatitis     Patient Active Problem List   Diagnosis Date Noted   Malnutrition of moderate degree 12/22/2017   Pancreatitis 09/06/2015    Past Surgical History:  Procedure Laterality Date   none      Prior to Admission medications   Medication Sig Start Date End Date Taking? Authorizing Provider  amLODipine (NORVASC) 10 MG tablet Take 10 mg by mouth daily.   Yes [provider]    Allergies Patient has no known allergies.  Family History  Problem Relation Age of Onset   Stroke Mother     Social History Social History   Tobacco Use   Smoking status: Every Day    Packs/day: 0.50    Types: Cigarettes   Smokeless tobacco: Never  Substance Use Topics   Alcohol use: Yes    Comment: 3-4 beers daily   Drug use: No    Review of Systems Constitutional: No fever/chills Eyes: No visual  changes. ENT: No sore throat. Cardiovascular: Denies chest pain. Respiratory: Denies shortness of breath. Gastrointestinal: No abdominal pain.  No nausea, no vomiting.  No diarrhea. Genitourinary: Negative for dysuria. Musculoskeletal: Negative for acute arthralgias Skin: Negative for rash.  Positive for bilateral lower extremity edema Neurological: Negative for headaches, weakness/numbness/paresthesias in any extremity Psychiatric: Negative for suicidal ideation/homicidal ideation   ____________________________________________   PHYSICAL EXAM:  VITAL SIGNS: ED Triage Vitals [02/15/21 1040]  Enc Vitals Group     BP (!) 133/97     Pulse Rate 80     Resp 17     Temp 98.4 F (36.9 C)     Temp Source Oral     SpO2 100 %     Weight 135 lb (61.2 kg)     Height 5\' 7"  (1.702 m)     Head Circumference      Peak Flow      Pain Score      Pain Loc      Pain Edu?      Excl. in GC?    Constitutional: Alert and oriented. Well appearing and in no acute distress. Eyes: Conjunctivae are normal. PERRL. Head: Atraumatic. Nose: No congestion/rhinnorhea. Mouth/Throat: Mucous membranes are moist. Neck: No stridor Cardiovascular: Grossly normal heart sounds.  Good peripheral circulation.  1+ pitting bilateral lower extremity edema Respiratory: Normal respiratory effort.  No retractions. Gastrointestinal: Soft and nontender. No distention. Musculoskeletal: No obvious deformities Neurologic:  Normal speech and language. No gross focal neurologic deficits are appreciated. Skin:  Skin is warm and dry. No rash noted. Psychiatric: Mood and affect are normal. Speech and behavior are normal.  ____________________________________________   LABS (all labs ordered are listed, but only abnormal results are displayed)  Labs Reviewed - No data to display ____________________________________________ RADIOLOGY  ED MD interpretation: Doppler ultrasound of the left leg shows no evidence of acute  DVT  Official radiology report(s): US Venous Img Lower Unilateral Left  Result Date: 02/15/2021 CLINICAL DATA:  46 year old female with leg swelling. EXAM: LEFT LOWER EXTREMITY VENOUS DOPPLER ULTRASOUND TECHNIQUE: Gray-scale sonography with graded compression, as well as color Doppler and duplex ultrasound were performed to evaluate the left lower extremity deep venous systems from the level of the common femoral vein and including the common femoral, femoral, profunda femoral, popliteal and calf veins including the posterior tibial, peroneal and gastrocnemius veins when visible. Spectral Doppler was utilized to evaluate flow at rest and with distal augmentation maneuvers in the common femoral, femoral and popliteal veins. The contralateral common femoral vein was also evaluated for comparison. COMPARISON:  None. FINDINGS: LEFT LOWER EXTREMITY Common Femoral Vein: No evidence of thrombus. Normal compressibility, respiratory phasicity and response to augmentation. Central Greater Saphenous Vein: No evidence of thrombus. Normal compressibility and flow on color Doppler imaging. Central Profunda Femoral Vein: No evidence of thrombus. Normal compressibility and flow on color Doppler imaging. Femoral Vein: No evidence of thrombus. Normal compressibility, respiratory phasicity and response to augmentation. Popliteal Vein: No evidence of thrombus. Normal compressibility, respiratory phasicity and response to augmentation. Calf Veins: No evidence of thrombus. Normal compressibility and flow on color Doppler imaging. Other Findings:  None. RIGHT LOWER EXTREMITY Common Femoral Vein: No evidence of thrombus. Normal compressibility, respiratory phasicity and response to augmentation. IMPRESSION: No evidence of left lower extremity deep venous thrombosis. Marliss Coots, MD [facility] Radiology Electronically Signed   By: Marliss Coots M.D.   On: 02/15/2021 11:36     ____________________________________________   PROCEDURES  Procedure(s) performed (including Critical Care):  Procedures   ____________________________________________   INITIAL IMPRESSION / ASSESSMENT AND PLAN / ED COURSE  As part of my medical decision making, I reviewed the following data within the electronic medical record, if available:  Nursing notes reviewed and incorporated, Labs reviewed, EKG interpreted, Old chart reviewed, Radiograph reviewed and Notes from prior ED visits reviewed and incorporated      Patient is a 45 year old female with above-stated history and physical exam who presents for bilateral lower extremity edema that occurs when she is at work standing for prolonged period of time.  DDx: DVT, phlegmasia cerulea dolens, phlegmasia albicans, acute heart failure, acute kidney failure  Doppler ultrasound of the left lower extremity shows no evidence of acute DVT  Exam consistent with likely stasis edema given patient works in a job where she is standing for prolonged period every day.  Recommended compression stockings and intermittent walks or movement of the feet throughout her job.  Also recommend elevating feet at the end of the day above the level of the heart.  Patient expressed understanding and all questions answered  Dispo: Discharge home with PCP follow-up      ____________________________________________   FINAL CLINICAL IMPRESSION(S) / ED DIAGNOSES  Final diagnoses:  Bilateral lower extremity edema     ED Discharge Orders     None        Note:  This document was prepared using Dragon voice  recognition software and may include unintentional dictation errors.    Merwyn Katos, MD 02/15/21 [phone]

## 2021-02-15 NOTE — ED Triage Notes (Signed)
Pt here with left leg swelling that started a week ago. Pt states both legs are swollen but more on the left. Pt states that she is having more pain as well. Pt in NAD in triage.

## 2022-05-02 IMAGING — MG MM DIGITAL SCREENING BILAT W/ TOMO AND CAD
8 series · 9 of 24 positions shown · non-contrast
Comparison: None.

CLINICAL DATA: Screening.

EXAM:
DIGITAL SCREENING BILATERAL MAMMOGRAM WITH TOMOSYNTHESIS AND CAD
TECHNIQUE: Bilateral screening digital craniocaudal and mediolateral oblique
mammograms were obtained. Bilateral screening digital breast
tomosynthesis was performed. The images were evaluated with
computer-aided detection.

[L MLO synth-2D]
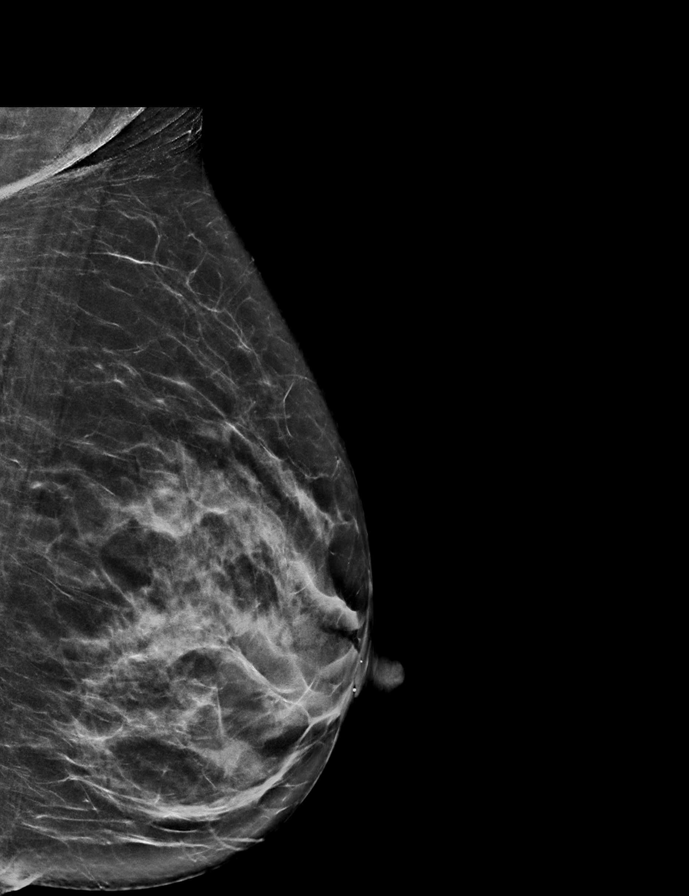

[L CC synth-2D]
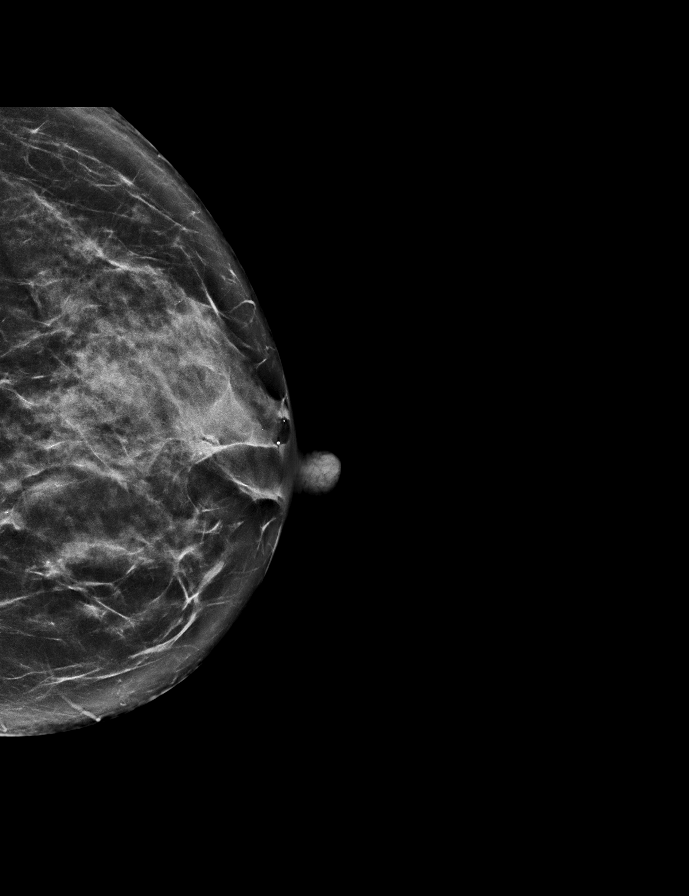

[R CC synth-2D]
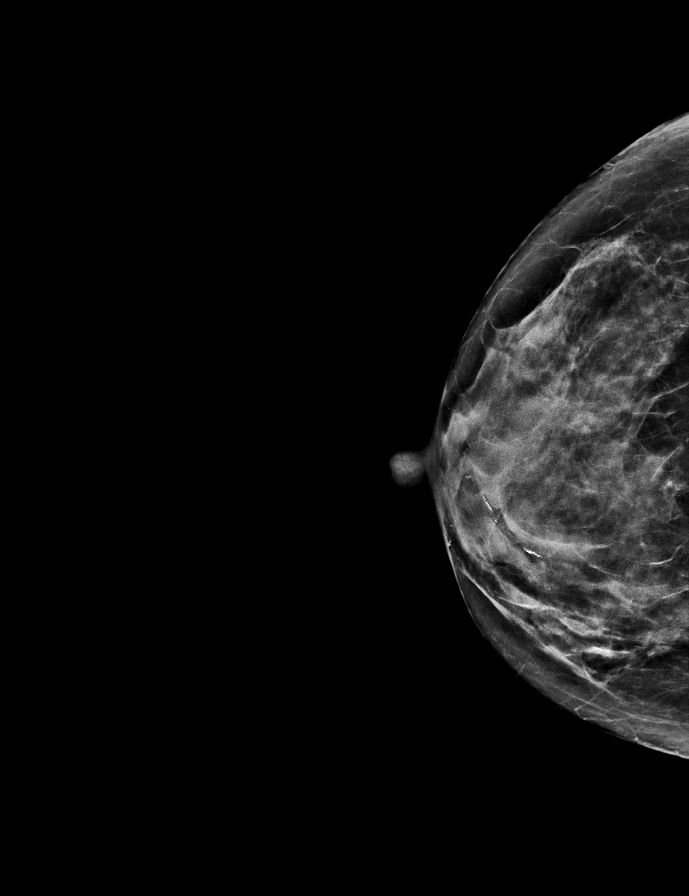

[R MLO synth-2D]
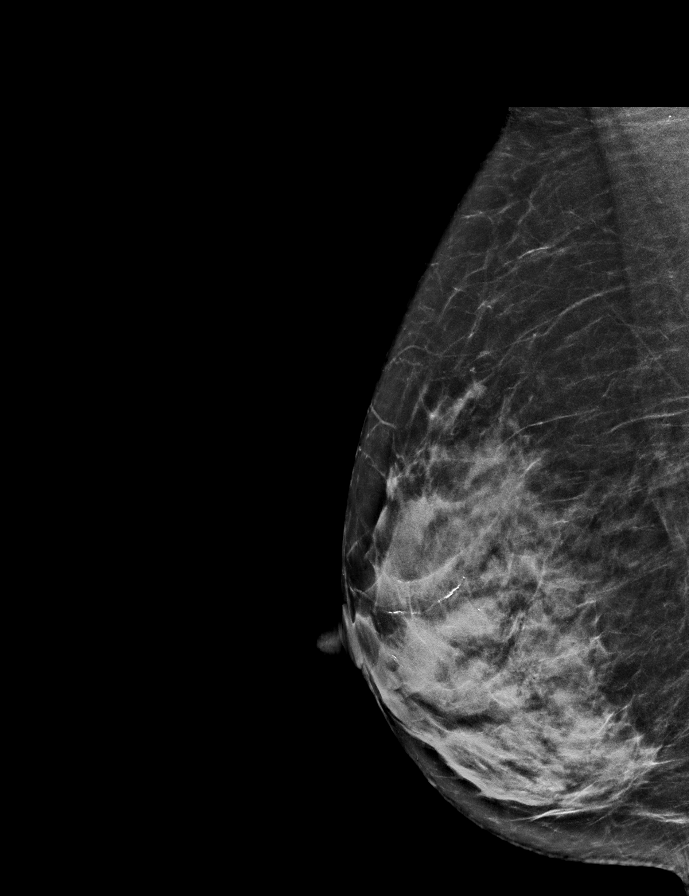

[R MLO tomo · 2 of 58 frames shown]
[frame 19/58]
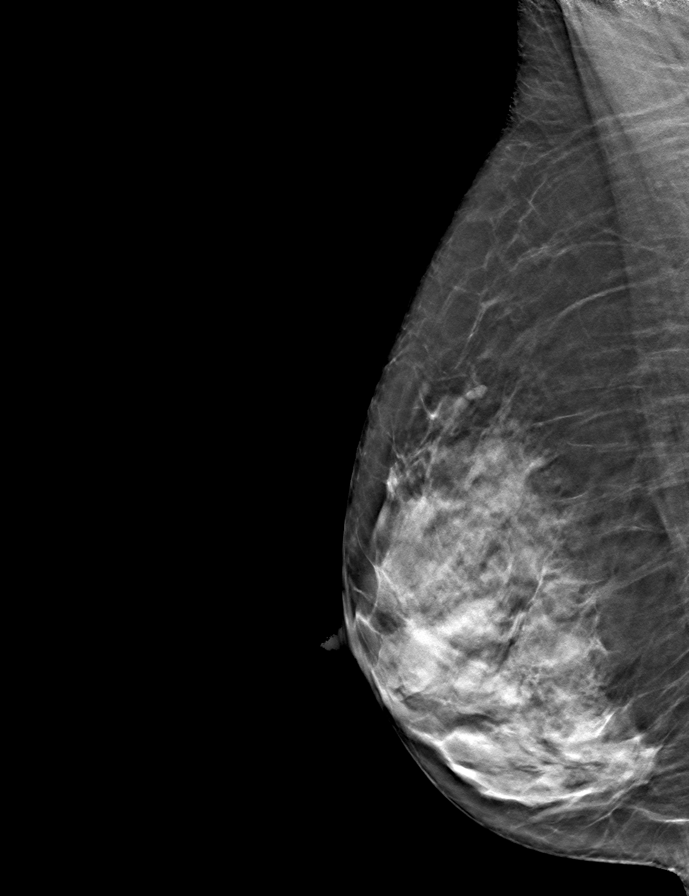
[frame 29/58]
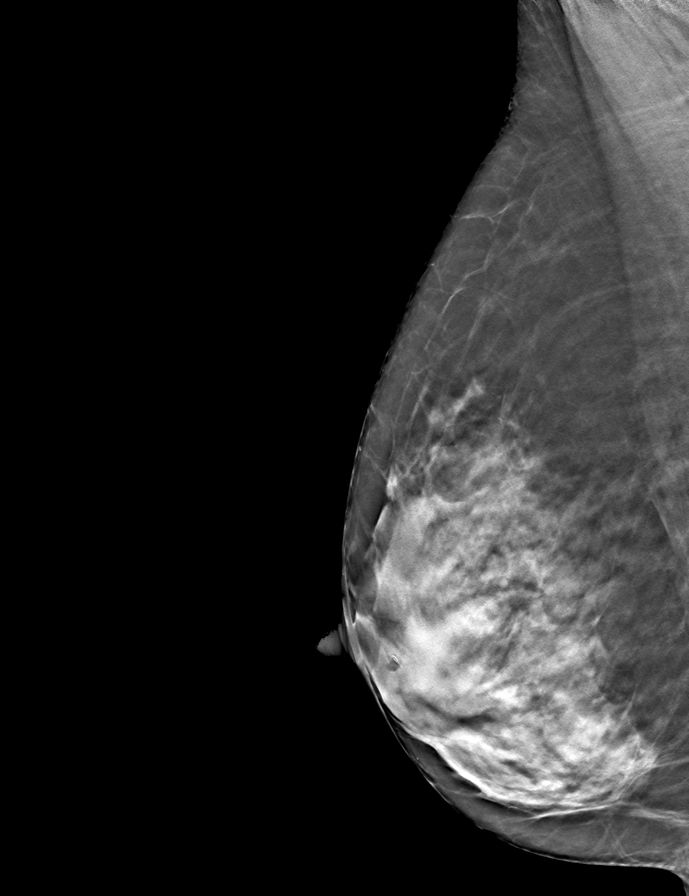

[R CC tomo · tomo slice 29/57.0]
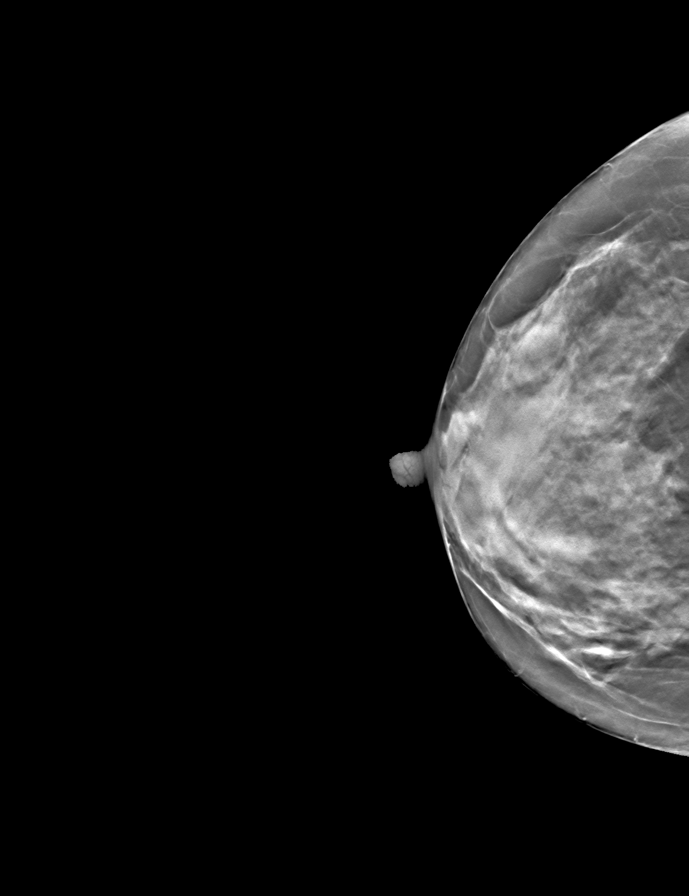

[L CC tomo · tomo slice 31/60.0]
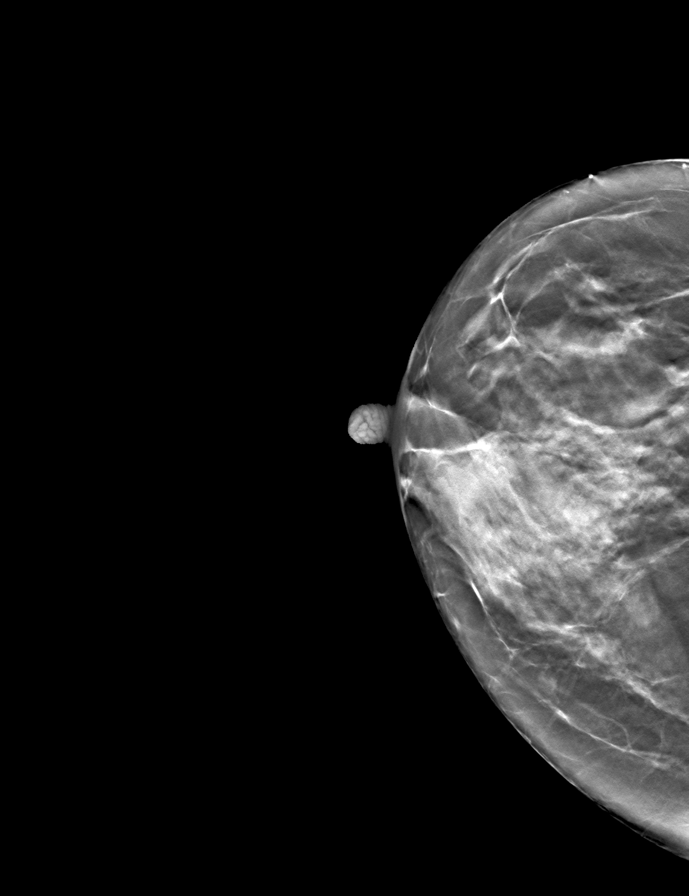

[L MLO tomo · tomo slice 32/63.0]
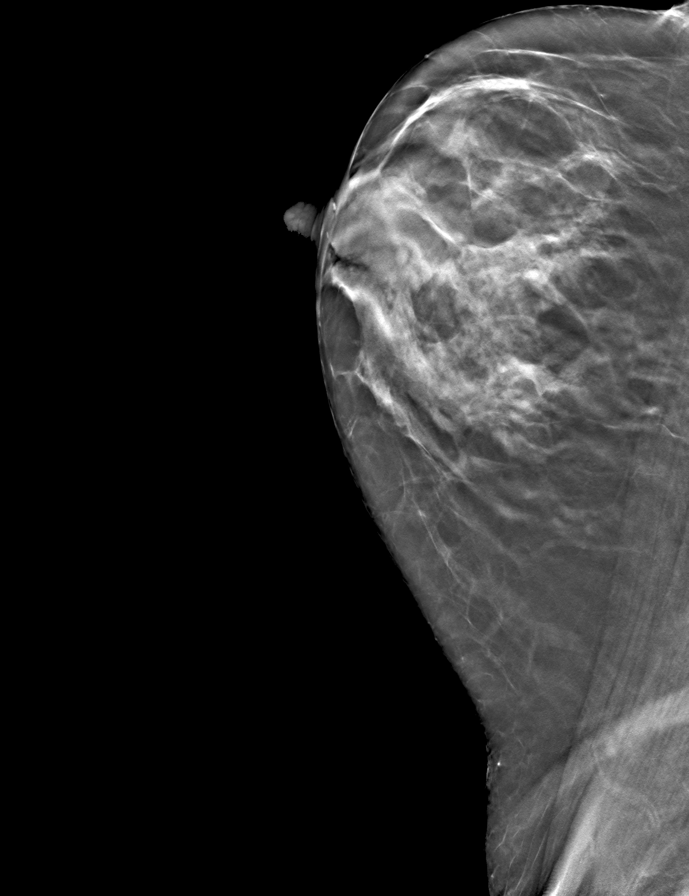

[9 of 24 positions shown; findings below may reference images not displayed]

ACR Breast Density Category c: The breast tissue is heterogeneously
dense, which may obscure small masses
FINDINGS: There are no findings suspicious for malignancy.
IMPRESSION: No mammographic evidence of malignancy. A result letter of this
screening mammogram will be mailed directly to the patient.

RECOMMENDATION:
Screening mammogram in one year. (Code:C8-T-HNK)

BI-RADS CATEGORY  1: Negative.

## 2022-07-20 ENCOUNTER — Emergency Department
Admission: EM | Admit: 2022-07-20 | Discharge: 2022-07-20 | Disposition: A | Discharge status: Home / Self Care | Payer: BC Managed Care – PPO | Attending: Emergency Medicine | Admitting: Emergency Medicine

## 2022-07-20 ENCOUNTER — Other Ambulatory Visit: Payer: Self-pay

## 2022-07-20 DIAGNOSIS — W540XXA Bitten by dog, initial encounter: Secondary | ICD-10-CM | POA: Insufficient documentation

## 2022-07-20 DIAGNOSIS — S81812A Laceration without foreign body, left lower leg, initial encounter: Secondary | ICD-10-CM | POA: Insufficient documentation

## 2022-07-20 DIAGNOSIS — S81852A Open bite, left lower leg, initial encounter: Secondary | ICD-10-CM

## 2022-07-20 MED ORDER — AMOXICILLIN-POT CLAVULANATE 875-125 MG PO TABS: 1.0000 | ORAL_TABLET | Freq: Two times a day (BID) | ORAL | 0 refills | Status: AC

## 2022-07-20 MED ORDER — AMOXICILLIN-POT CLAVULANATE 875-125 MG PO TABS
1.0000 | ORAL_TABLET | Freq: Once | ORAL | Status: AC
  Administered 2022-07-20: 1 via ORAL
  Filled 2022-07-20: qty 1

## 2022-07-20 NOTE — Discharge Instructions (Signed)
Please keep dog bite site clean and covered.  You may shower and get wet.  Take antibiotics as prescribed.  Follow-up for any increasing pain swelling warmth redness

## 2022-07-20 NOTE — ED Triage Notes (Signed)
Pt reports her neighbors dog bit her left lower leg. She reports the dogs immunizations are not up to date.

## 2022-07-20 NOTE — ED Provider Notes (Signed)
[place] EMERGENCY DEPARTMENT AT [facility] Provider Note   CSN: [id number] Arrival date & time: 07/20/22  1637     History  Chief Complaint  Patient presents with   Animal Bite    Barbara Bates is a 47 y.o. female.  Presents to the emergency department for evaluation of dog bite to the left lower leg.  She has a little abrasion/skin tear to the left calf.  No puncture wound or laceration.  Small dog bit her left calf.  Animal control has been notified and dog's vaccination status is unknown at this time.  Tetanus is up-to-date.  Pain control.  No numbness or tingling.  HPI     Home Medications Prior to Admission medications   Medication Sig Start Date End Date Taking? Authorizing Provider  amoxicillin-clavulanate (AUGMENTIN) 875-125 MG tablet Take 1 tablet by mouth 2 (two) times daily for 7 days. 07/20/22 07/27/22 Yes Evon Slack, PA-C  amLODipine (NORVASC) 10 MG tablet Take 10 mg by mouth daily.    [provider]      Allergies    Patient has no known allergies.    Review of Systems   Review of Systems  Physical Exam Updated Vital Signs BP (!) 139/102   Pulse (!) 101   Temp 98 F (36.7 C) (Oral)   Resp 18   Ht  (1.626 m)   Wt 61.2 kg   LMP 12/19/2017 (Exact Date)   SpO2 99%   BMI 23.17 kg/m  Physical Exam Constitutional:      Appearance: She is well-developed.  HENT:     Head: Normocephalic and atraumatic.  Eyes:     Conjunctiva/sclera: Conjunctivae normal.  Cardiovascular:     Rate and Rhythm: Normal rate.  Pulmonary:     Effort: Pulmonary effort is normal. No respiratory distress.  Musculoskeletal:        General: Normal range of motion.     Cervical back: Normal range of motion.     Comments: Sensation intact distally.  No swelling or edema.  Calf is soft.  1 cm skin tear, no deep puncture wounds or lacerations seen.  Ankle plantarflexion dorsiflexion is intact.  She ambulates well no antalgic gait no assistive ice.  No  visible or palpable foreign body  Skin:    General: Skin is warm.     Findings: No rash.  Neurological:     Mental Status: She is alert and oriented to person, place, and time.  Psychiatric:        Behavior: Behavior normal.        Thought Content: Thought content normal.     ED Results / Procedures / Treatments   Labs (all labs ordered are listed, but only abnormal results are displayed) Labs Reviewed - No data to display  EKG None  Radiology No results found.  Procedures Procedures   Medications Ordered in ED Medications  amoxicillin-clavulanate (AUGMENTIN) 875-125 MG per tablet 1 tablet (has no administration in time range)    ED Course/ Medical Decision Making/ A&P                             Medical Decision Making Risk Prescription drug management.   18 year old with dog bite to the left lower leg.  Dog bite superficial.  Cleansed thoroughly with Betadine and saline and dressing applied.  No gaping wounds requiring sutures.  Tetanus up-to-date.  Animal control has been notified.  She  is placed on prophylactic antibiotics.  She understands signs symptoms return to the ER for. Final Clinical Impression(s) / ED Diagnoses Final diagnoses:  Dog bite of left lower leg, initial encounter    Rx / DC Orders ED Discharge Orders          Ordered    amoxicillin-clavulanate (AUGMENTIN) 875-125 MG tablet  2 times daily        07/20/22 1816              Ronnette Juniper 07/20/22 1846    Chesley Noon, MD 07/20/22 [phone]

## 2022-07-20 NOTE — ED Notes (Addendum)
Pt reports bite took place [address], [place], [place] - [employer] called for report

## 2022-08-02 IMAGING — US US EXTREM LOW VENOUS*L*
1 series · 13 of 24 positions shown · non-contrast
Comparison: None.

CLINICAL DATA: 45-year-old female with leg swelling.

EXAM:
LEFT LOWER EXTREMITY VENOUS DOPPLER ULTRASOUND
TECHNIQUE: Gray-scale sonography with graded compression, as well as color
Doppler and duplex ultrasound were performed to evaluate the left
lower extremity deep venous systems from the level of the common
femoral vein and including the common femoral, femoral, profunda
femoral, popliteal and calf veins including the posterior tibial,
peroneal and gastrocnemius veins when visible. Spectral Doppler was
utilized to evaluate flow at rest and with distal augmentation
maneuvers in the common femoral, femoral and popliteal veins. The
contralateral common femoral vein was also evaluated for comparison.

[Series 1: us venous img lower uni left (dvt) · portal-venous · 13 of 33 slices shown]
[im 1/33]
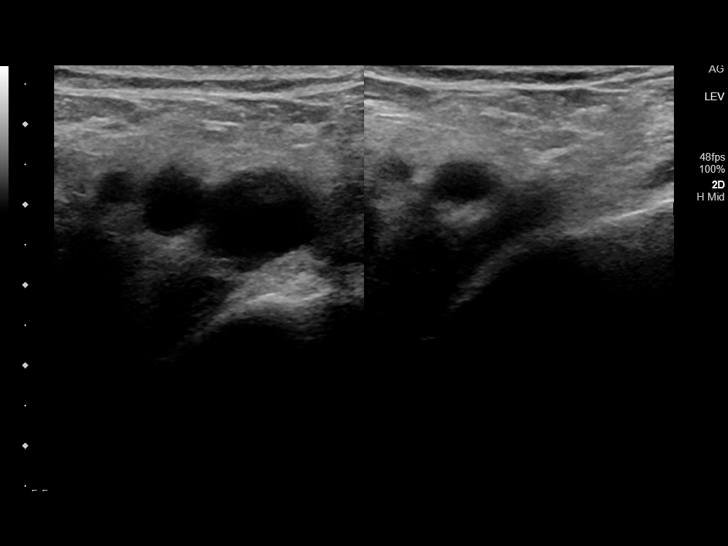
[im 3/33]
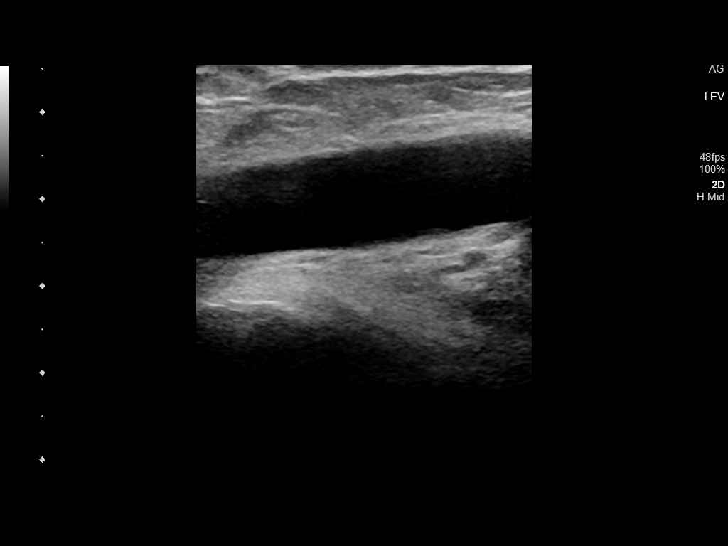
[im 6/33]
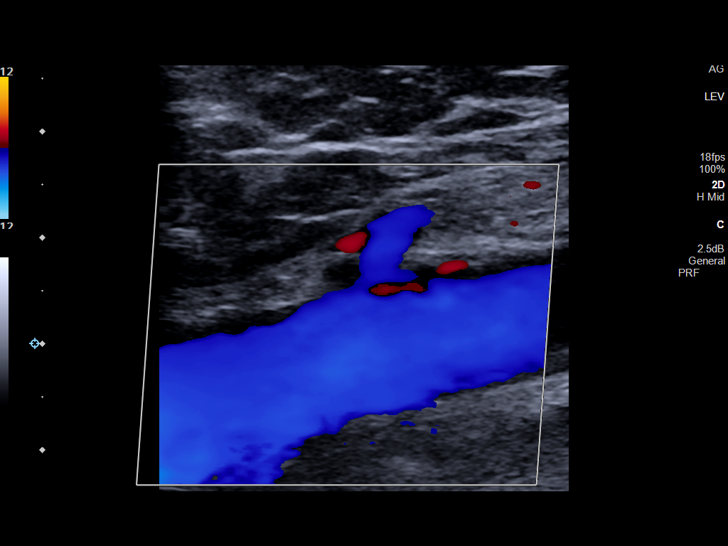
[im 9/33]
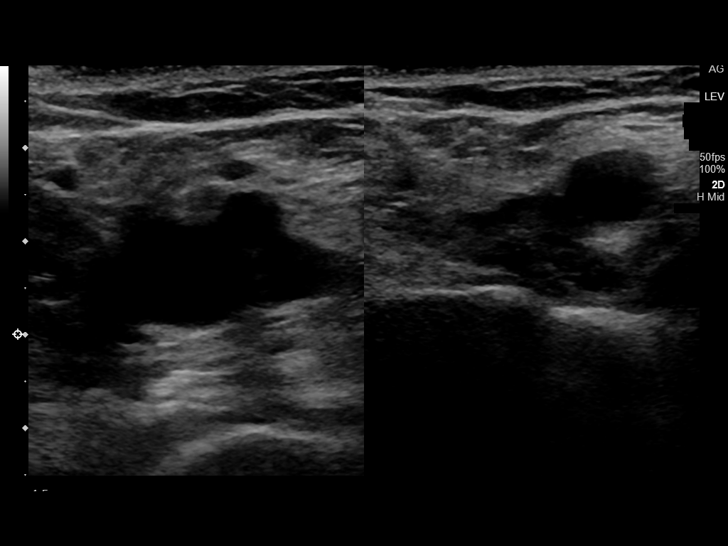
[im 12/33]
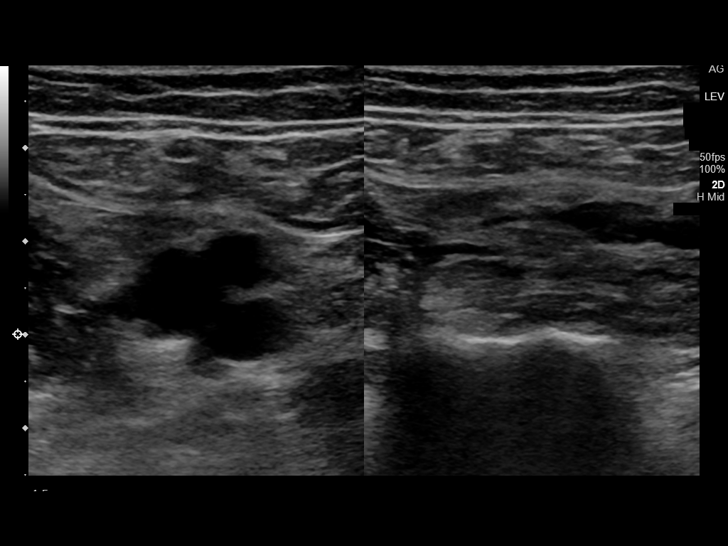
[im 14/33]
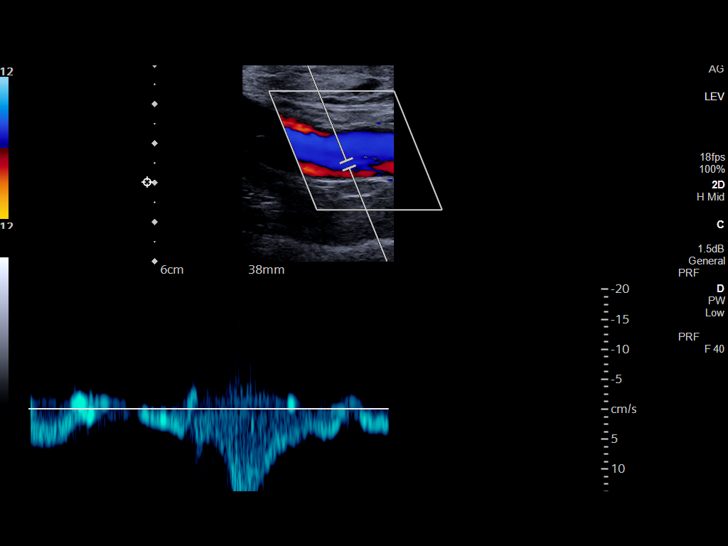
[im 17/33]
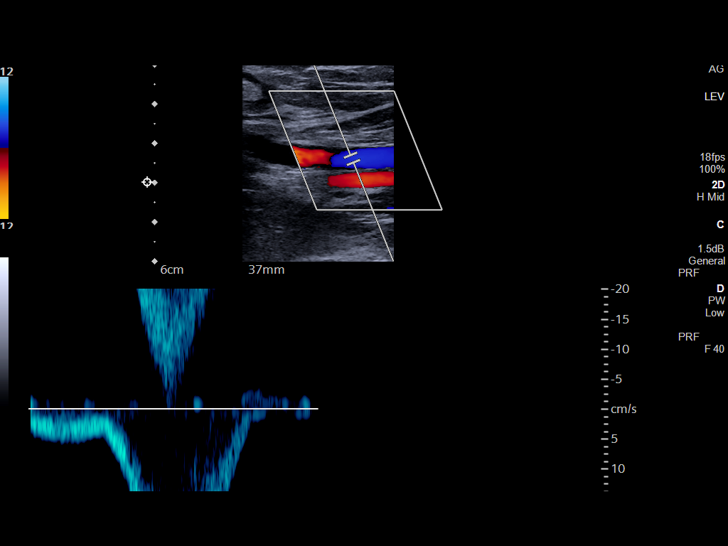
[im 19/33]
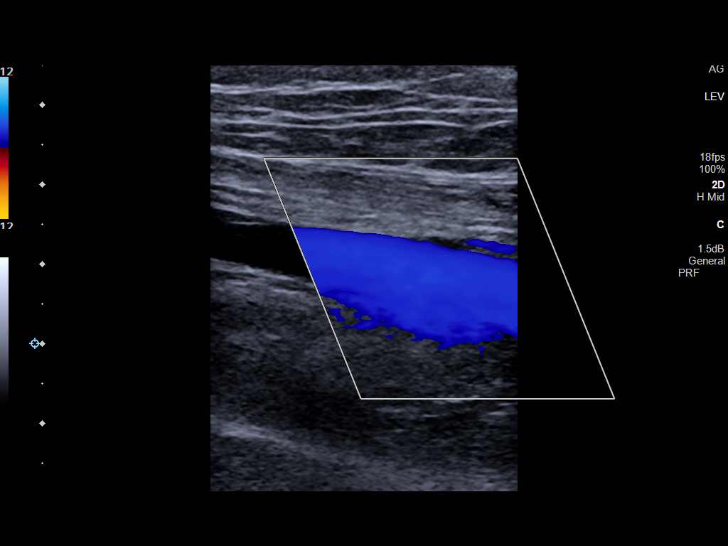
[im 21/33]
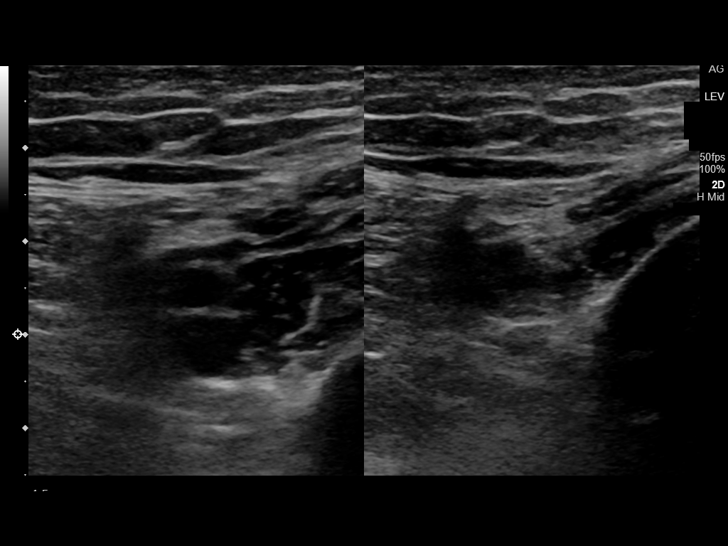
[im 24/33]
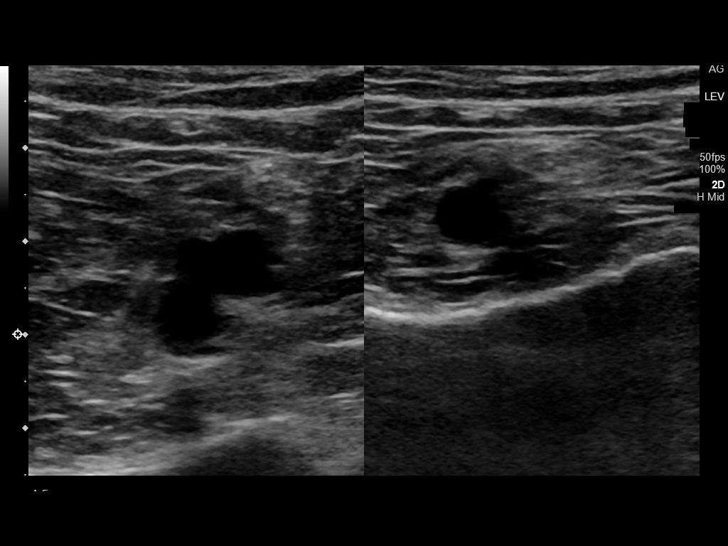
[im 27/33]
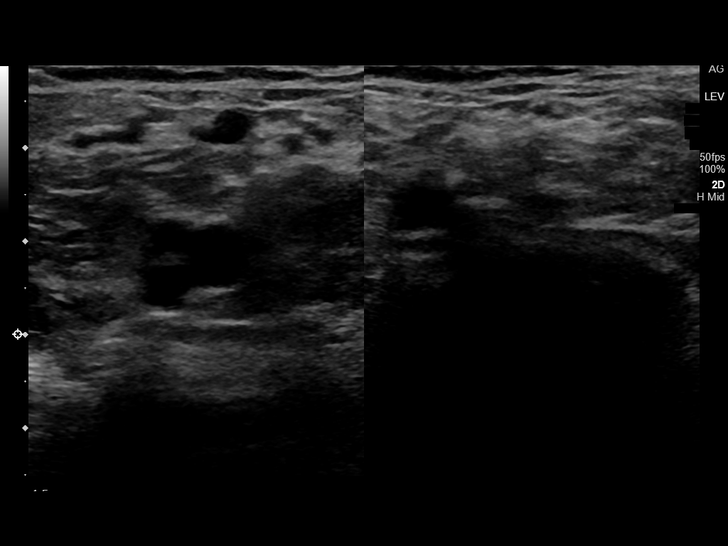
[im 30/33]
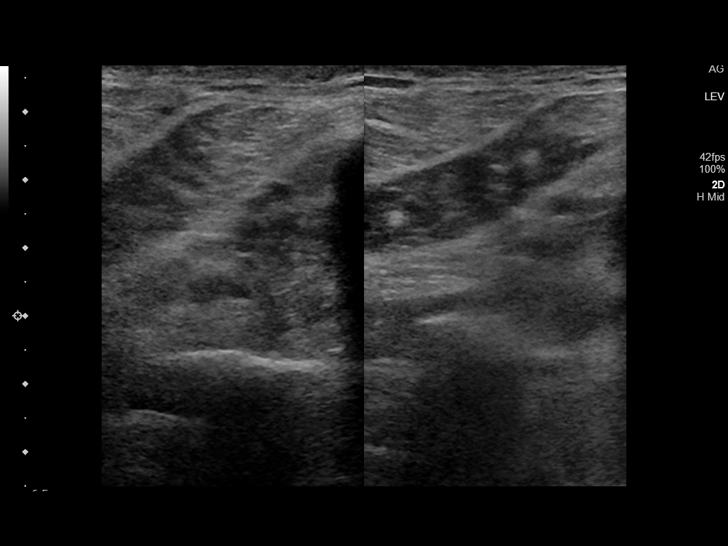
[im 33/33]
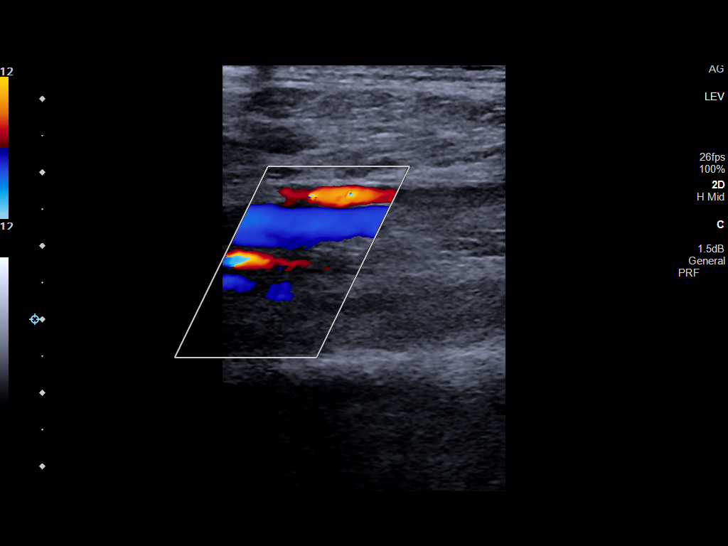

[13 of 24 positions shown; findings below may reference images not displayed]

FINDINGS: LEFT LOWER EXTREMITY

Common Femoral Vein: No evidence of thrombus. Normal
compressibility, respiratory phasicity and response to augmentation.

Central Greater Saphenous Vein: No evidence of thrombus. Normal
compressibility and flow on color Doppler imaging.

Central Profunda Femoral Vein: No evidence of thrombus. Normal
compressibility and flow on color Doppler imaging.

Femoral Vein: No evidence of thrombus. Normal compressibility,
respiratory phasicity and response to augmentation.

Popliteal Vein: No evidence of thrombus. Normal compressibility,
respiratory phasicity and response to augmentation.

Calf Veins: No evidence of thrombus. Normal compressibility and flow
on color Doppler imaging.

Other Findings:  None.

RIGHT LOWER EXTREMITY

Common Femoral Vein: No evidence of thrombus. Normal
compressibility, respiratory phasicity and response to augmentation.
IMPRESSION: No evidence of left lower extremity deep venous thrombosis.

## 2022-08-07 ENCOUNTER — Other Ambulatory Visit: Payer: Self-pay

## 2022-08-07 ENCOUNTER — Inpatient Hospital Stay
Admission: EM | Admit: 2022-08-07 | Discharge: 2022-08-13 | DRG: 439 | Disposition: A | Discharge status: Home / Self Care | Payer: BC Managed Care – PPO | Attending: Student | Admitting: Student

## 2022-08-07 ENCOUNTER — Emergency Department: Payer: BC Managed Care – PPO

## 2022-08-07 DIAGNOSIS — E663 Overweight: Secondary | ICD-10-CM | POA: Diagnosis present

## 2022-08-07 DIAGNOSIS — R188 Other ascites: Secondary | ICD-10-CM | POA: Diagnosis present

## 2022-08-07 DIAGNOSIS — E876 Hypokalemia: Secondary | ICD-10-CM | POA: Diagnosis present

## 2022-08-07 DIAGNOSIS — F1010 Alcohol abuse, uncomplicated: Secondary | ICD-10-CM | POA: Diagnosis not present

## 2022-08-07 DIAGNOSIS — F17210 Nicotine dependence, cigarettes, uncomplicated: Secondary | ICD-10-CM | POA: Diagnosis present

## 2022-08-07 DIAGNOSIS — K8521 Alcohol induced acute pancreatitis with uninfected necrosis: Secondary | ICD-10-CM | POA: Diagnosis present

## 2022-08-07 DIAGNOSIS — Z79899 Other long term (current) drug therapy: Secondary | ICD-10-CM | POA: Diagnosis not present

## 2022-08-07 DIAGNOSIS — E162 Hypoglycemia, unspecified: Secondary | ICD-10-CM | POA: Diagnosis present

## 2022-08-07 DIAGNOSIS — Z823 Family history of stroke: Secondary | ICD-10-CM

## 2022-08-07 DIAGNOSIS — R10816 Epigastric abdominal tenderness: Secondary | ICD-10-CM | POA: Diagnosis present

## 2022-08-07 DIAGNOSIS — Z6825 Body mass index (BMI) 25.0-25.9, adult: Secondary | ICD-10-CM | POA: Diagnosis not present

## 2022-08-07 DIAGNOSIS — K8591 Acute pancreatitis with uninfected necrosis, unspecified: Secondary | ICD-10-CM | POA: Diagnosis present

## 2022-08-07 DIAGNOSIS — E559 Vitamin D deficiency, unspecified: Secondary | ICD-10-CM | POA: Diagnosis present

## 2022-08-07 DIAGNOSIS — E871 Hypo-osmolality and hyponatremia: Secondary | ICD-10-CM | POA: Diagnosis present

## 2022-08-07 DIAGNOSIS — K5900 Constipation, unspecified: Secondary | ICD-10-CM | POA: Diagnosis present

## 2022-08-07 DIAGNOSIS — Z716 Tobacco abuse counseling: Secondary | ICD-10-CM | POA: Diagnosis not present

## 2022-08-07 LAB — HIV ANTIBODY (ROUTINE TESTING W REFLEX): HIV Screen 4th Generation wRfx: NONREACTIVE

## 2022-08-07 LAB — COMPREHENSIVE METABOLIC PANEL
ALT: 17 U/L (ref 0–44)
AST: 34 U/L (ref 15–41)
Albumin: 3.6 g/dL (ref 3.5–5.0)
Alkaline Phosphatase: 119 U/L (ref 38–126)
Anion gap: 8 (ref 5–15)
BUN: 15 mg/dL (ref 6–20)
CO2: 25 mmol/L (ref 22–32)
Calcium: 8.8 mg/dL — ABNORMAL LOW (ref 8.9–10.3)
Chloride: 106 mmol/L (ref 98–111)
Creatinine, Ser: 0.99 mg/dL (ref 0.44–1.00)
GFR, Estimated: >60 mL/min (ref >60)
Glucose, Bld: 147 mg/dL — ABNORMAL HIGH (ref 70–99)
Potassium: 3.4 mmol/L — ABNORMAL LOW (ref 3.5–5.1)
Sodium: 139 mmol/L (ref 135–145)
Total Bilirubin: 0.5 mg/dL (ref 0.3–1.2)
Total Protein: 7.3 g/dL (ref 6.5–8.1)

## 2022-08-07 LAB — URINALYSIS, ROUTINE W REFLEX MICROSCOPIC
Bilirubin Urine: NEGATIVE
Glucose, UA: NEGATIVE mg/dL
Hgb urine dipstick: NEGATIVE
Ketones, ur: NEGATIVE mg/dL
Nitrite: NEGATIVE
Protein, ur: NEGATIVE mg/dL
Specific Gravity, Urine: 1.016 (ref 1.005–1.030)
pH: 5 (ref 5.0–8.0)

## 2022-08-07 LAB — URINE DRUG SCREEN, QUALITATIVE (ARMC ONLY)
Amphetamines, Ur Screen: NOT DETECTED
Barbiturates, Ur Screen: NOT DETECTED
Benzodiazepine, Ur Scrn: NOT DETECTED
Cannabinoid 50 Ng, Ur ~~LOC~~: NOT DETECTED
Cocaine Metabolite,Ur ~~LOC~~: NOT DETECTED
MDMA (Ecstasy)Ur Screen: NOT DETECTED
Methadone Scn, Ur: NOT DETECTED
Opiate, Ur Screen: POSITIVE — AB
Phencyclidine (PCP) Ur S: NOT DETECTED
Tricyclic, Ur Screen: NOT DETECTED

## 2022-08-07 LAB — CBC WITH DIFFERENTIAL/PLATELET
Abs Immature Granulocytes: 0.03 10*3/uL (ref 0.00–0.07)
Basophils Absolute: 0.1 10*3/uL (ref 0.0–0.1)
Basophils Relative: 1 %
Eosinophils Absolute: 0.3 10*3/uL (ref 0.0–0.5)
Eosinophils Relative: 5 %
HCT: 38.3 % (ref 36.0–46.0)
Hemoglobin: 12.5 g/dL (ref 12.0–15.0)
Immature Granulocytes: 1 %
Lymphocytes Relative: 56 %
Lymphs Abs: 3.2 10*3/uL (ref 0.7–4.0)
MCH: 32.5 pg (ref 26.0–34.0)
MCHC: 32.6 g/dL (ref 30.0–36.0)
MCV: 99.5 fL (ref 80.0–100.0)
Monocytes Absolute: 0.3 10*3/uL (ref 0.1–1.0)
Monocytes Relative: 5 %
Neutro Abs: 1.8 10*3/uL (ref 1.7–7.7)
Neutrophils Relative %: 32 %
Platelets: 240 10*3/uL (ref 150–400)
RBC: 3.85 MIL/uL — ABNORMAL LOW (ref 3.87–5.11)
RDW: 13.2 % (ref 11.5–15.5)
WBC: 5.7 10*3/uL (ref 4.0–10.5)
nRBC: 0 % (ref 0.0–0.2)

## 2022-08-07 LAB — ETHANOL: Alcohol, Ethyl (B): <10 mg/dL (ref <10)

## 2022-08-07 LAB — POC URINE PREG, ED: Preg Test, Ur: NEGATIVE

## 2022-08-07 LAB — LIPASE, BLOOD: Lipase: 717 U/L — ABNORMAL HIGH (ref 11–51)

## 2022-08-07 LAB — LACTATE DEHYDROGENASE: LDH: 150 U/L (ref 98–192)

## 2022-08-07 MED ORDER — LACTATED RINGERS IV SOLN: INTRAVENOUS | Status: DC

## 2022-08-07 MED ORDER — MORPHINE SULFATE (PF) 4 MG/ML IV SOLN
4.0000 mg | INTRAVENOUS | Status: DC | PRN
  Administered 2022-08-07 – 2022-08-13 (×29): 4 mg via INTRAVENOUS
  Filled 2022-08-07 (×29): qty 1

## 2022-08-07 MED ORDER — ONDANSETRON HCL 4 MG PO TABS: 4.0000 mg | ORAL_TABLET | Freq: Four times a day (QID) | ORAL | Status: DC | PRN

## 2022-08-07 MED ORDER — SODIUM CHLORIDE 0.9 % IV SOLN
6.2500 mg | Freq: Four times a day (QID) | INTRAVENOUS | Status: DC | PRN
  Administered 2022-08-07 – 2022-08-09 (×3): 6.25 mg via INTRAVENOUS
  Filled 2022-08-07 (×4): qty 0.25

## 2022-08-07 MED ORDER — MORPHINE SULFATE (PF) 4 MG/ML IV SOLN
4.0000 mg | Freq: Once | INTRAVENOUS | Status: AC
  Administered 2022-08-07: 4 mg via INTRAVENOUS
  Filled 2022-08-07: qty 1

## 2022-08-07 MED ORDER — LORAZEPAM 2 MG/ML IJ SOLN: 1.0000 mg | INTRAMUSCULAR | Status: AC | PRN

## 2022-08-07 MED ORDER — THIAMINE HCL 100 MG/ML IJ SOLN
100.0000 mg | Freq: Every day | INTRAMUSCULAR | Status: DC
  Administered 2022-08-08 – 2022-08-09 (×2): 100 mg via INTRAVENOUS
  Filled 2022-08-07 (×2): qty 2

## 2022-08-07 MED ORDER — ONDANSETRON HCL 4 MG/2ML IJ SOLN
4.0000 mg | Freq: Once | INTRAMUSCULAR | Status: AC
  Administered 2022-08-07: 4 mg via INTRAVENOUS
  Filled 2022-08-07: qty 2

## 2022-08-07 MED ORDER — THIAMINE MONONITRATE 100 MG PO TABS
100.0000 mg | ORAL_TABLET | Freq: Every day | ORAL | Status: DC
  Administered 2022-08-07 – 2022-08-13 (×5): 100 mg via ORAL
  Filled 2022-08-07 (×7): qty 1

## 2022-08-07 MED ORDER — SODIUM CHLORIDE 0.9 % IV SOLN
INTRAVENOUS | Status: DC
Start: 2022-08-07 — End: 2022-08-07

## 2022-08-07 MED ORDER — ONDANSETRON HCL 4 MG/2ML IJ SOLN
4.0000 mg | Freq: Four times a day (QID) | INTRAMUSCULAR | Status: DC | PRN
  Administered 2022-08-07 – 2022-08-12 (×5): 4 mg via INTRAVENOUS
  Filled 2022-08-07 (×6): qty 2

## 2022-08-07 MED ORDER — FOLIC ACID 1 MG PO TABS
1.0000 mg | ORAL_TABLET | Freq: Every day | ORAL | Status: DC
  Administered 2022-08-07 – 2022-08-13 (×6): 1 mg via ORAL
  Filled 2022-08-07 (×7): qty 1

## 2022-08-07 MED ORDER — NICOTINE 14 MG/24HR TD PT24
14.0000 mg | MEDICATED_PATCH | Freq: Every day | TRANSDERMAL | Status: DC
  Administered 2022-08-07 – 2022-08-13 (×7): 14 mg via TRANSDERMAL
  Filled 2022-08-07 (×7): qty 1

## 2022-08-07 MED ORDER — IOHEXOL 300 MG/ML  SOLN
100.0000 mL | Freq: Once | INTRAMUSCULAR | Status: AC | PRN
  Administered 2022-08-07: 100 mL via INTRAVENOUS

## 2022-08-07 MED ORDER — LORAZEPAM 1 MG PO TABS: 1.0000 mg | ORAL_TABLET | ORAL | Status: AC | PRN

## 2022-08-07 MED ORDER — ENOXAPARIN SODIUM 40 MG/0.4ML IJ SOSY
40.0000 mg | PREFILLED_SYRINGE | Freq: Every day | INTRAMUSCULAR | Status: DC
  Administered 2022-08-08 – 2022-08-13 (×6): 40 mg via SUBCUTANEOUS
  Filled 2022-08-07 (×6): qty 0.4

## 2022-08-07 MED ORDER — SODIUM CHLORIDE 0.9 % IV BOLUS
1000.0000 mL | Freq: Once | INTRAVENOUS | Status: AC
Start: 2022-08-07 — End: 2022-08-07
  Administered 2022-08-07: 1000 mL via INTRAVENOUS

## 2022-08-07 MED ORDER — ADULT MULTIVITAMIN W/MINERALS CH
1.0000 | ORAL_TABLET | Freq: Every day | ORAL | Status: DC
  Administered 2022-08-07 – 2022-08-13 (×6): 1 via ORAL
  Filled 2022-08-07 (×7): qty 1

## 2022-08-07 NOTE — H&P (Addendum)
History and Physical    Patient: Barbara Bates:[id number] DOB: [birth year] DOA: 08/07/2022 DOS: the patient was seen and examined on 08/07/2022 PCP: [facility]  Patient coming from: Home  Chief Complaint:  Chief Complaint  Patient presents with   Abdominal Pain   HPI: Barbara Bates is a 47 y.o. female with medical history significant of alcohol abuse and pancreatitis presenting with necrotizing pancreatitis.  Patient reports developing severe abdominal pain over the past 12 to 24 hours.  Abdominal pain generalized but predominately around the periumbilical area.  Positive nausea and vomiting.  No fevers or chills.  No hemiparesis or confusion.  No chest pain or shortness of breath.  Patient reports regular use of alcohol.  Drinks 2-3 beers daily as well as regular wine.  Last use was last night.  No reported illicit drug use.  1/2 pack/day smoker.  Has had a recurrent history of pancreatitis in the past.  Pain worsening prior. Presented to the ER afebrile, hemodynamically stable.  White count 5.7, hemoglobin 12.5, platelets 240, creatinine 0.99, potassium 3.4, AST 34, ALT 17, T. bili 0.5.  CT of the abdomen pelvis with findings concerning for acute pancreatitis with concern for necrotizing pancreatitis in the pancreatic head.  Dr. Norma Fredrickson with gastroenterology has been formally evaluated for consultation. Review of Systems: As mentioned in the history of present illness. All other systems reviewed and are negative. Past Medical History:  Diagnosis Date   History of syphilis    Pancreatitis    Past Surgical History:  Procedure Laterality Date   none     Social History:  reports that she has been smoking. She has been smoking an average of .5 packs per day. She has never used smokeless tobacco. She reports current alcohol use. She reports that she does not use drugs.  No Known Allergies  Family History  Problem Relation Age of Onset   Stroke Mother     Prior  to Admission medications   Medication Sig Start Date End Date Taking? Authorizing Provider  amLODipine (NORVASC) 10 MG tablet Take 10 mg by mouth daily.    [provider]    Physical Exam: Vitals:   08/07/22 0549  BP: (!) 162/95  Pulse: 74  Resp: 17  Temp: 97.6 F (36.4 C)  TempSrc: Oral  SpO2: 100%   Physical Exam Constitutional:      Appearance: She is normal weight.  HENT:     Head: Normocephalic.     Nose: Nose normal.     Mouth/Throat:     Mouth: Mucous membranes are moist.  Eyes:     Pupils: Pupils are equal, round, and reactive to light.  Cardiovascular:     Rate and Rhythm: Normal rate and regular rhythm.  Pulmonary:     Effort: Pulmonary effort is normal.  Abdominal:     General: Bowel sounds are normal.     Tenderness: There is abdominal tenderness.  Genitourinary:    General: Normal vulva.  Musculoskeletal:        General: Normal range of motion.  Neurological:     General: No focal deficit present.  Psychiatric:        Mood and Affect: Mood normal.     Data Reviewed:  There are no new results to review at this time. CT ABDOMEN PELVIS W CONTRAST CLINICAL DATA:  Pancreatitis  EXAM: CT ABDOMEN AND PELVIS WITH CONTRAST  TECHNIQUE: Multidetector CT imaging of the abdomen and pelvis was performed using  the standard protocol following bolus administration of intravenous contrast.  RADIATION DOSE REDUCTION: This exam was performed according to the departmental dose-optimization program which includes automated exposure control, adjustment of the mA and/or kV according to patient size and/or use of iterative reconstruction technique.  CONTRAST:  OMNIPAQUE IOHEXOL 300 MG/ML  SOLN  COMPARISON:  CT abdomen and pelvis dated September 06, 2015  FINDINGS: Lower chest: No acute abnormality.  Hepatobiliary: No focal liver abnormality is seen. No gallstones, gallbladder wall thickening, or biliary dilatation.  Pancreas: Edematous pancreatic  head with adjacent fat stranding and ill-defined fluid which extends inferiorly along the right paracolic gutter. Area of gland hypoenhancement involving the pancreatic head seen on series 5, image 41. No organized peripancreatic fluid collection.No evidence of main pancreatic duct dilation.  Spleen: Normal in size without focal abnormality.  Adrenals/Urinary Tract: Bilateral adrenal glands are unremarkable. No hydronephrosis or nephrolithiasis. Bladder is decompressed.  Stomach/Bowel: Stomach is within normal limits. Appendix appears normal. Mild diverticulosis. No evidence of bowel wall thickening, distention, or inflammatory changes.  Vascular/Lymphatic: No aortic atherosclerosis, mild atherosclerosis of the iliac arteries. No enlarged abdominal or pelvic lymph nodes.  Reproductive: Uterus and bilateral adnexa are unremarkable.  Other: No abdominal wall hernia or abnormality.  Musculoskeletal: No acute or significant osseous findings.  IMPRESSION: Findings consistent with acute pancreatitis. Focal area of hypoenhancement involving the pancreatic head, consistent with necrotizing pancreatitis. Recommend follow-up with MRCP after normalization of lipase or 1-2 months if lipase remains elevated.  Electronically Signed   By: Allegra Lai M.D.   On: 08/07/2022 08:59  Lab Results  Component Value Date   WBC 5.7 08/07/2022   HGB 12.5 08/07/2022   HCT 38.3 08/07/2022   MCV 99.5 08/07/2022   PLT 240 08/07/2022   Last metabolic panel Lab Results  Component Value Date   GLUCOSE 147 (H) 08/07/2022   NA 139 08/07/2022   K 3.4 (L) 08/07/2022   CL 106 08/07/2022   CO2 25 08/07/2022   BUN 15 08/07/2022   CREATININE 0.99 08/07/2022   GFRNONAA >60 08/07/2022   CALCIUM 8.8 (L) 08/07/2022   PHOS 2.4 (L) 12/23/2017   PROT 7.3 08/07/2022   ALBUMIN 3.6 08/07/2022   BILITOT 0.5 08/07/2022   ALKPHOS 119 08/07/2022   AST 34 08/07/2022   ALT 17 08/07/2022   ANIONGAP 8  08/07/2022   Assessment and Plan: * Necrotizing pancreatitis Necrotizing pancreatitis on imaging in the setting of heavy alcohol use Lipase 717 White count within normal limits LFTs within normal limits Case discussed with on-call gastroenterologist Dr. Norma Fredrickson Will otherwise monitor for now N.p.o. Aggressive IV fluids Pain control Ranson score tentatively 0 pending LDH  Follow-up formal GI recommendations Follow  ETOH abuse Patient reports 2-3 beers daily as well as wine No reported liquor use Alcohol level within normal limits CIWA protocol Check urine drug screen Discussed cessation at length Follow  Tobacco abuse counseling 1/2 PPD smoker  Discussed cessation  Nicotine patch       Advance Care Planning:   Code Status: Full Code   Consults: Gastroenterology w/ Dr. Norma Fredrickson   Family Communication: No family at the bedside   Severity of Illness: The appropriate patient status for this patient is INPATIENT. Inpatient status is judged to be reasonable and necessary in order to provide the required intensity of service to ensure the patient's safety. The patient's presenting symptoms, physical exam findings, and initial radiographic and laboratory data in the context of their chronic comorbidities is felt to place  them at high risk for further clinical deterioration. Furthermore, it is not anticipated that the patient will be medically stable for discharge from the hospital within 2 midnights of admission.   * I certify that at the point of admission it is my clinical judgment that the patient will require inpatient hospital care spanning beyond 2 midnights from the point of admission due to high intensity of service, high risk for further deterioration and high frequency of surveillance required.*  Author: Floydene Flock, MD 08/07/2022 10:15 AM  For on call review [email or web address].

## 2022-08-07 NOTE — Plan of Care (Signed)
  Problem: Education: Goal: Knowledge of General Education information will improve Description: Including pain rating scale, medication(s)/side effects and non-pharmacologic comfort measures Outcome: Progressing   Problem: Clinical Measurements: Goal: Ability to maintain clinical measurements within normal limits will improve Outcome: Progressing   Problem: Activity: Goal: Risk for activity intolerance will decrease Outcome: Progressing   Problem: Coping: Goal: Level of anxiety will decrease Outcome: Progressing   Problem: Safety: Goal: Ability to remain free from injury will improve Outcome: Progressing   

## 2022-08-07 NOTE — Assessment & Plan Note (Signed)
Patient reports 2-3 beers daily as well as wine No reported liquor use Alcohol level within normal limits CIWA protocol Check urine drug screen Discussed cessation at length Follow

## 2022-08-07 NOTE — ED Triage Notes (Signed)
Pt presents to ER with c/o upper abd pain that started this morning.  Pt states she has some associated n/v with the pain.  Pt has hx of alcohol induced pancreatitis and states she did have some alcohol yesterday.  Pt tearful in triage, but is otherwise A&O x4 and in NAD.

## 2022-08-07 NOTE — ED Provider Notes (Signed)
[facility] Provider Note    Event Date/Time   First MD Initiated Contact with Patient 08/07/22 [phone]     (approximate)   History   Abdominal Pain   HPI  Barbara Bates is a 47 y.o. female with a past medical history of alcohol induced pancreatitis presents today for evaluation of epigastric pain that began last night.  Patient reports that she drinks alcohol every other day, and reports that last night she drank 2 beers.  She reports that her pain began this morning.  She had 2 episodes of vomiting while in the emergency department.  No fevers or chills.  She denies history of any abdominal surgery.  No lower abdominal pain.  No urinary symptoms.  She denies history of alcohol withdrawal.  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 --      Excl. in GC? --     Most recent vital signs: Vitals:   08/07/22 0549 08/07/22 1112  BP: (!) 162/95 (!) 153/100  Pulse: 74 88  Resp: 17 16  Temp: 97.6 F (36.4 C) 97.8 F (36.6 C)  SpO2: 100%     Physical Exam Vitals and nursing note reviewed.  Constitutional:      General: Awake and alert. No acute distress.    Appearance: Normal appearance. The patient is overweight.  HENT:     Head: Normocephalic and atraumatic.     Mouth: Mucous membranes are moist.  Eyes:     General: PERRL. Normal EOMs        Right  eye: No discharge.        Left eye: No discharge.     Conjunctiva/sclera: Conjunctivae normal.  Cardiovascular:     Rate and Rhythm: Normal rate and regular rhythm.     Pulses: Normal pulses.  Pulmonary:     Effort: Pulmonary effort is normal. No respiratory distress.     Breath sounds: Normal breath sounds.  Abdominal:     Abdomen is soft. There is epigastric abdominal tenderness.  Mild guarding present, no rebound. No distention. Musculoskeletal:        General: No swelling. Normal range of motion.     Cervical back: Normal range of motion and neck supple.  Skin:    General: Skin is warm and dry.     Capillary Refill: Capillary refill takes less than 2 seconds.     Findings: No rash.  Neurological:     Mental Status: The patient is awake and alert.      ED Results / Procedures / Treatments  Labs (all labs ordered are listed, but only abnormal results are displayed) Labs Reviewed  CBC WITH DIFFERENTIAL/PLATELET - Abnormal; Notable for the following components:      Result Value   RBC 3.85 (*)    All other components within normal limits  COMPREHENSIVE METABOLIC PANEL - Abnormal; Notable for the following components:   Potassium 3.4 (*)    Glucose, Bld 147 (*)    Calcium 8.8 (*)    All other components within normal limits  LIPASE, BLOOD - Abnormal; Notable for the following components:   Lipase 717 (*)    All other components within normal limits  URINALYSIS, ROUTINE W REFLEX MICROSCOPIC - Abnormal; Notable for the following components:   Color, Urine YELLOW (*)    APPearance HAZY (*)    Leukocytes,Ua SMALL (*)    Bacteria, UA MANY (*)    All other components within normal limits  ETHANOL  LACTATE DEHYDROGENASE  HIV ANTIBODY (ROUTINE TESTING W REFLEX)  URINE DRUG SCREEN, QUALITATIVE ([facility] ONLY)  POC URINE PREG, ED     EKG     RADIOLOGY I independently reviewed and interpreted imaging and agree with radiologists findings.     PROCEDURES:  Critical  Care performed:   Procedures   MEDICATIONS ORDERED IN ED: Medications  enoxaparin (LOVENOX) injection 40 mg (40 mg Subcutaneous Patient Refused/Not Given 08/07/22 1211)  0.9 %  sodium chloride infusion ( Intravenous New Bag/Given 08/07/22 1017)  ondansetron (ZOFRAN) tablet 4 mg ( Oral See Alternative 08/07/22 1306)    Or  ondansetron (ZOFRAN) injection 4 mg (4 mg Intravenous Given 08/07/22 1306)  morphine (PF) 4 MG/ML injection 4 mg (4 mg Intravenous Given 08/07/22 1230)  LORazepam (ATIVAN) tablet 1-4 mg (has no administration in time range)    Or  LORazepam (ATIVAN) injection 1-4 mg (has no administration in time range)  thiamine (VITAMIN B1) tablet 100 mg (100 mg Oral Given 08/07/22 1042)    Or  thiamine (VITAMIN B1) injection 100 mg ( Intravenous See Alternative 08/07/22 1042)  folic acid (FOLVITE) tablet 1 mg (1 mg Oral Given 08/07/22 1042)  multivitamin with minerals tablet 1 tablet (1 tablet Oral Given 08/07/22 1042)  nicotine (NICODERM CQ - dosed in mg/24 hours) patch 14 mg (14 mg Transdermal Patch Applied 08/07/22 1042)  morphine (PF) 4 MG/ML injection 4 mg (4 mg Intravenous Given 08/07/22 0740)  ondansetron (ZOFRAN) injection 4 mg (4 mg Intravenous Given 08/07/22 0740)  sodium chloride 0.9 % bolus 1,000 mL (1,000 mLs Intravenous New Bag/Given 08/07/22 0738)  iohexol (OMNIPAQUE) 300 MG/ML solution 100 mL (100 mLs Intravenous Contrast Given 08/07/22 0811)  morphine (PF) 4 MG/ML injection 4 mg (4 mg Intravenous Given 08/07/22 0914)     IMPRESSION / MDM / ASSESSMENT AND PLAN / ED COURSE  I reviewed the triage vital signs and the nursing notes.   Differential diagnosis includes, but is not limited to, pancreatitis, alcohol withdrawal, dehydration, pseudocyst, cholecystitis, choledocholithiasis.  I reviewed the patient's chart.  Patient was admitted in 2019 for pancreatitis.  Patient presents the emergency department uncomfortable appearing, though hemodynamically stable and afebrile.  She has tenderness  in her epigastrium.  She agreed to further workup.  IV was established and labs were obtained.  Labs reveal a lipase of 717, this is improved from her previous episode which was 3941.  CT scan was obtained for evaluation of complicated pancreatitis T revealed necrotizing pancreatitis.  This was discussed with Dr. Norma Fredrickson who has agreed to consult on the patient.  He reports that these patients can become very sick very quickly and will often require intensivist care.  However, given that she is currently hemodynamically stable and afebrile patient is currently stable for the floor.  I discussed with Dr. Alvester Morin who has agreed to admit the patient.  Dr. Norma Fredrickson reports that for now we can treat the patient as acute pancreatitis with hydration and pain control.  Patient agrees with plan for admission.  She has never had a history of alcohol withdrawal, though will require alcohol withdrawal monitoring.   Patient's presentation is most consistent with acute presentation with potential threat to life or bodily function.   Clinical Course as of 08/07/22 1341  Wed Aug 07, 2022  1610 Discussed with Dr. Norma Fredrickson, gastroenterology who reports that these patients can become sick very quickly but for now it is okay to treat as acute pancreatitis and he will come to see the patient [JP]    Clinical Course User Index [JP] Wayman Hoard, Herb Grays, PA-C     FINAL CLINICAL IMPRESSION(S) / ED DIAGNOSES   Final diagnoses:  Necrotizing pancreatitis     Rx / DC Orders   ED Discharge Orders     None        Note:  This document was prepared using Dragon voice recognition software and may include unintentional dictation errors.   Keturah Shavers 08/07/22 1341    Sharyn Creamer, MD 08/11/22 [phone]

## 2022-08-07 NOTE — Assessment & Plan Note (Addendum)
Necrotizing pancreatitis on imaging in the setting of heavy alcohol use Lipase 717 White count within normal limits LFTs within normal limits Case discussed with on-call gastroenterologist Dr. Norma Fredrickson Will otherwise monitor for now N.p.o. Aggressive IV fluids Pain control Ranson score tentatively 0 pending LDH  Follow-up formal GI recommendations Follow

## 2022-08-07 NOTE — ED Notes (Signed)
Pt reports upper abdominal pain that started this morning. Pt reports 1 episode of vomiting when she got to the hospital. Pt is actively vomiting on assessment. Pt reports hx/o pancreatitis. Pt reports that she did drink alcohol last night. Pt reports that she drinks every other day. Pt reports that she did not drink more than normal yesterday.

## 2022-08-07 NOTE — Consult Note (Signed)
[facility] GI Inpatient Consult Note   Jamey Reas, M.D.  Reason for Consult: Acute necrotizing pancreatitis   Attending Requesting Consult: Doree Albee, MD  History of Present Illness: Barbara Bates is a 47 y.o. female with a history of regular alcohol use who presented to the emergency room this morning after awakening up with severe abdominal pain around 4:00 AM.  Patient's last alcoholic beverages were yesterday and she claims she only had "3 beers".  She has had a previous hospital admission for acute pancreatitis "a few years ago" and was hospitalized for about a week.  Patient says she drinks anywhere between 2-3 beers per day after work as well as an unspecified amount of wine on the weekends along with more beer.  She denies any use of hard liquor or illicit drugs other than marijuana.  Patient denies any change in bowel habits, rectal bleeding and denies upper GI symptoms of hematemesis.  She has been on no medications that are new.  She has been taking no antibiotics.  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   unk   naproxen sodium (ALEVE) 220 MG tablet Take 220 mg by mouth daily as needed.   Past Week   Home medication reconciliation was completed with the patient.   Scheduled Inpatient Medications:     enoxaparin (LOVENOX) injection  40 mg Subcutaneous Daily   folic acid  1 mg Oral Daily   multivitamin with minerals  1 tablet Oral Daily   nicotine  14 mg Transdermal Daily   thiamine  100 mg Oral Daily   Or   thiamine  100 mg Intravenous Daily    Continuous Inpatient Infusions:    sodium chloride 125 mL/hr at 08/07/22 1413   promethazine (PHENERGAN) injection (IM or IVPB)      PRN Inpatient Medications:  LORazepam **OR** LORazepam, morphine injection, ondansetron **OR** ondansetron (ZOFRAN) IV, promethazine (PHENERGAN) injection (IM or IVPB)  Family History: family history includes Stroke in her mother.   GI Family History: Negative.  Social History:   reports that she has been smoking. She has been smoking an average of .5 packs per day. She has never used smokeless tobacco. She reports current alcohol use. She reports that she does not use drugs. The patient denies ETOH, tobacco, or drug use.    Review of Systems: Review of Systems - Negative except history of present illness  Physical Examination: BP (!) 154/107 (BP Location: Left Arm)   Pulse 94   Temp 98 F (36.7 C) (Oral)   Resp 15   Ht 5\' 4"  (1.626 m)   Wt 66.8 kg   LMP 12/19/2017 (Exact Date)   SpO2 100%   BMI 25.28 kg/m  Physical Exam Constitutional:      General: She is not in  acute distress.    Appearance: She is well-developed. She is ill-appearing and diaphoretic. She is not toxic-appearing.  HENT:     Head: Normocephalic and atraumatic.  Eyes:     Extraocular Movements: Extraocular movements intact.     Pupils: Pupils are equal, round, and reactive to light.  Cardiovascular:     Rate and Rhythm: Tachycardia present.     Heart sounds: No murmur heard.    No friction rub. No gallop.  Pulmonary:     Effort: Pulmonary effort is normal. No respiratory distress.     Breath sounds: Normal breath sounds. No stridor. No rhonchi.  Abdominal:     General: Abdomen is protuberant. Bowel sounds are normal.  There is distension. There is no abdominal bruit.     Palpations: Abdomen is soft. There is no fluid wave or splenomegaly.     Tenderness: There is generalized abdominal tenderness. There is no guarding or rebound.     Hernia: No hernia is present.  Skin:    General: Skin is warm.  Neurological:     General: No focal deficit present.     Mental Status: She is alert.  Psychiatric:        Mood and Affect: Mood normal.        Behavior: Behavior normal.     Data: Lab Results  Component Value Date   WBC 5.7 08/07/2022   HGB 12.5 08/07/2022   HCT 38.3 08/07/2022   MCV 99.5 08/07/2022   PLT 240 08/07/2022   Recent Labs  Lab 08/07/22 0549  HGB 12.5   Lab Results  Component Value Date   NA 139 08/07/2022   K 3.4 (L) 08/07/2022   CL 106 08/07/2022   CO2 25 08/07/2022   BUN 15 08/07/2022   CREATININE 0.99 08/07/2022   Lab Results  Component Value Date   ALT 17 08/07/2022   AST 34 08/07/2022   ALKPHOS 119 08/07/2022   BILITOT 0.5 08/07/2022   No results for input(s): "APTT", "INR", "PTT" in the last 168 hours.    Latest Ref Rng & Units 08/07/2022    5:49 AM 12/21/2017   11:27 PM 12/24/2015    3:26 PM  CBC  WBC 4.0 - 10.5 K/uL 5.7  6.8  10.7   Hemoglobin 12.0 - 15.0 g/dL 56.2  13.0  86.5   Hematocrit 36.0 - 46.0 % 38.3  34.9  34.3   Platelets 150 - 400 K/uL 240  172  404     STUDIES: CT ABDOMEN PELVIS W CONTRAST  Result Date: 08/07/2022 CLINICAL DATA:  Pancreatitis EXAM: CT ABDOMEN AND PELVIS WITH CONTRAST TECHNIQUE: Multidetector CT imaging of the abdomen and pelvis was performed using the standard protocol following bolus administration of intravenous contrast. RADIATION DOSE REDUCTION: This exam was performed according to the departmental dose-optimization program which includes automated exposure control, adjustment of the mA and/or kV according to patient size and/or use of iterative reconstruction technique. CONTRAST:  OMNIPAQUE IOHEXOL 300 MG/ML  SOLN  COMPARISON:  CT abdomen and pelvis dated September 06, 2015 FINDINGS: Lower chest: No acute abnormality. Hepatobiliary: No focal liver abnormality is seen. No gallstones, gallbladder wall thickening, or biliary dilatation. Pancreas: Edematous pancreatic head with adjacent fat stranding and ill-defined fluid which extends inferiorly along the right paracolic gutter. Area of gland hypoenhancement involving the pancreatic head seen on series 5, image 41. No organized peripancreatic fluid collection.No evidence of main pancreatic duct dilation. Spleen: Normal in size without focal abnormality. Adrenals/Urinary Tract: Bilateral  adrenal glands are unremarkable. No hydronephrosis or nephrolithiasis. Bladder is decompressed. Stomach/Bowel: Stomach is within normal limits. Appendix appears normal. Mild diverticulosis. No evidence of bowel wall thickening, distention, or inflammatory changes. Vascular/Lymphatic: No aortic atherosclerosis, mild atherosclerosis of the iliac arteries. No enlarged abdominal or pelvic lymph nodes. Reproductive: Uterus and bilateral adnexa are unremarkable. Other: No abdominal wall hernia or abnormality. Musculoskeletal: No acute or significant osseous findings. IMPRESSION: Findings consistent with acute pancreatitis. Focal area of hypoenhancement involving the pancreatic head, consistent with necrotizing pancreatitis. Recommend follow-up with MRCP after normalization of lipase or 1-2 months if lipase remains elevated. Electronically Signed   By: Allegra Lai M.D.   On: 08/07/2022 08:59   @IMAGES @  Assessment: 1.  Acute necrotizing pancreatitis-these findings are discordant with the patient's clinical presentation which appears stable.  White blood cell count is normal and lipase level only in the 700s.  Patient is hemodynamically stable.  She is requiring pain medication and is continuing IV hydration and n.p.o. status.  2.  Regular alcohol use/abuse-beer, wine. 3.  Tobacco  abuse    Recommendations:  NPO Brisk IV hydration 150-200 cc/hr. Switch to LR. Continue IV pain medication as needed. Repeat CT in 48-72 hours to reassess degree of panceatic necrosis. Will follow.  Thank you for the consult. Please call with questions or concerns.  Rosina Lowenstein, "Mellody Dance MD [facility] [address] [place], [place] [address] [phone]  08/07/2022 4:36 PM

## 2022-08-07 NOTE — Assessment & Plan Note (Signed)
1/2 PPD smoker  Discussed cessation  Nicotine patch

## 2022-08-08 DIAGNOSIS — K8591 Acute pancreatitis with uninfected necrosis, unspecified: Secondary | ICD-10-CM | POA: Diagnosis not present

## 2022-08-08 LAB — COMPREHENSIVE METABOLIC PANEL
ALT: 13 U/L (ref 0–44)
AST: 27 U/L (ref 15–41)
Albumin: 3.2 g/dL — ABNORMAL LOW (ref 3.5–5.0)
Alkaline Phosphatase: 93 U/L (ref 38–126)
Anion gap: 11 (ref 5–15)
BUN: 9 mg/dL (ref 6–20)
CO2: 23 mmol/L (ref 22–32)
Calcium: 8.5 mg/dL — ABNORMAL LOW (ref 8.9–10.3)
Chloride: 101 mmol/L (ref 98–111)
Creatinine, Ser: 0.66 mg/dL (ref 0.44–1.00)
GFR, Estimated: >60 mL/min (ref >60)
Glucose, Bld: 116 mg/dL — ABNORMAL HIGH (ref 70–99)
Potassium: 3.6 mmol/L (ref 3.5–5.1)
Sodium: 135 mmol/L (ref 135–145)
Total Bilirubin: 1.1 mg/dL (ref 0.3–1.2)
Total Protein: 6.5 g/dL (ref 6.5–8.1)

## 2022-08-08 LAB — CBC
HCT: 39.2 % (ref 36.0–46.0)
Hemoglobin: 13 g/dL (ref 12.0–15.0)
MCH: 33 pg (ref 26.0–34.0)
MCHC: 33.2 g/dL (ref 30.0–36.0)
MCV: 99.5 fL (ref 80.0–100.0)
Platelets: 168 10*3/uL (ref 150–400)
RBC: 3.94 MIL/uL (ref 3.87–5.11)
RDW: 12.8 % (ref 11.5–15.5)
WBC: 8.6 10*3/uL (ref 4.0–10.5)
nRBC: 0 % (ref 0.0–0.2)

## 2022-08-08 LAB — MAGNESIUM: Magnesium: 1.5 mg/dL — ABNORMAL LOW (ref 1.7–2.4)

## 2022-08-08 LAB — LIPASE, BLOOD: Lipase: 1063 U/L — ABNORMAL HIGH (ref 11–51)

## 2022-08-08 LAB — PHOSPHORUS: Phosphorus: 3.6 mg/dL (ref 2.5–4.6)

## 2022-08-08 LAB — VITAMIN D 25 HYDROXY (VIT D DEFICIENCY, FRACTURES): Vit D, 25-Hydroxy: 7.42 ng/mL — ABNORMAL LOW (ref 30–100)

## 2022-08-08 MED ORDER — PANTOPRAZOLE SODIUM 40 MG IV SOLR
40.0000 mg | Freq: Two times a day (BID) | INTRAVENOUS | Status: DC
  Administered 2022-08-08 – 2022-08-13 (×10): 40 mg via INTRAVENOUS
  Filled 2022-08-08 (×11): qty 10

## 2022-08-08 MED ORDER — MAGNESIUM SULFATE 4 GM/100ML IV SOLN
4.0000 g | Freq: Once | INTRAVENOUS | Status: AC
  Administered 2022-08-08: 4 g via INTRAVENOUS
  Filled 2022-08-08: qty 100

## 2022-08-08 NOTE — Plan of Care (Signed)
  Problem: Education: Goal: Knowledge of General Education information will improve Description: Including pain rating scale, medication(s)/side effects and non-pharmacologic comfort measures Outcome: Progressing   Problem: Health Behavior/Discharge Planning: Goal: Ability to manage health-related needs will improve Outcome: Progressing   Problem: Activity: Goal: Risk for activity intolerance will decrease Outcome: Progressing   Problem: Coping: Goal: Level of anxiety will decrease Outcome: Progressing   Problem: Elimination: Goal: Will not experience complications related to bowel motility Outcome: Progressing   Problem: Pain Managment: Goal: General experience of comfort will improve Outcome: Progressing   Problem: Safety: Goal: Ability to remain free from injury will improve Outcome: Progressing   

## 2022-08-08 NOTE — Progress Notes (Signed)
[facility] Gastroenterology Inpatient Progress Note    Subjective: Patient seen for f/u acute alcoholic pancreatitis. Patient says "Pain better, but still vomiting". Patient denies any desire to eat. No BM. Some flatus.  Objective: Vital signs in last 24 hours: Temp:  [97.8 F (36.6 C)-98.3 F (36.8 C)] 98.3 F (36.8 C) (05/09 0553) Pulse Rate:  [88-94] 93 (05/09 0553) Resp:  [15-18] 18 (05/09 0553) BP: (120-158)/(94-107) 158/94 (05/09 0553) SpO2:  [98 %-100 %] 99 % (05/09 0553) Weight:  [66.8 kg] 66.8 kg (05/08 1411) Blood pressure (!) 158/94, pulse 93, temperature 98.3 F (36.8 C), resp. rate 18, height 5\' 4"  (1.626 m), weight 66.8 kg, last menstrual period 12/19/2017, SpO2 99 %.    Intake/Output from previous day: 05/08 0701 - 05/09 0700 In: -  Out: 80 [Emesis/NG output:80]  Intake/Output this shift: No intake/output data recorded.   Gen: NAD. Appears mildly uncomfortable.  HEENT: [place]/AT. PERRLA. Normal external ear exam.  Chest: CTA, no wheezes.  CV: Slightly tachy, nl S1, S2. No gallops.  Abd: soft, mildly tender in epigastrium without guarding or rebound. BS hypoactive but present.  Ext: no edema. Pulses 2+  Neuro: Alert and oriented. Judgement appears normal. Nonfocal.   Lab Results: Results for orders placed or performed during the hospital encounter of 08/07/22 (from the past 24 hour(s))  HIV Antibody (routine testing w rflx)     Status: None   Collection Time: 08/07/22 10:19 AM  Result Value Ref Range   HIV Screen 4th Generation wRfx Non Reactive Non Reactive  Lactate dehydrogenase     Status: None   Collection Time: 08/07/22 10:19 AM  Result Value Ref Range   LDH 150 98 - 192 U/L  CBC     Status: None   Collection Time: 08/08/22  6:02 AM  Result Value Ref Range   WBC 8.6 4.0 - 10.5 K/uL   RBC 3.94 3.87 - 5.11 MIL/uL   Hemoglobin 13.0 12.0 - 15.0 g/dL   HCT 40.9 81.1 - 91.4 %   MCV 99.5 80.0 - 100.0 fL   MCH 33.0 26.0 - 34.0 pg   MCHC 33.2  30.0 - 36.0 g/dL   RDW 78.2 95.6 - 21.3 %   Platelets 168 150 - 400 K/uL   nRBC 0.0 0.0 - 0.2 %  Comprehensive metabolic panel     Status: Abnormal   Collection Time: 08/08/22  6:02 AM  Result Value Ref Range   Sodium 135 135 - 145 mmol/L   Potassium 3.6 3.5 - 5.1 mmol/L   Chloride 101 98 - 111 mmol/L   CO2 23 22 - 32 mmol/L   Glucose, Bld 116 (H) 70 - 99 mg/dL   BUN 9 6 - 20 mg/dL   Creatinine, Ser 0.86 0.44 - 1.00 mg/dL   Calcium 8.5 (L) 8.9 - 10.3 mg/dL   Total Protein 6.5 6.5 - 8.1 g/dL   Albumin 3.2 (L) 3.5 - 5.0 g/dL   AST 27 15 - 41 U/L   ALT 13 0 - 44 U/L   Alkaline Phosphatase 93 38 - 126 U/L   Total Bilirubin 1.1 0.3 - 1.2 mg/dL   GFR, Estimated >57 >84 mL/min   Anion gap 11 5 - 15     Recent Labs    08/07/22 0549 08/08/22 0602  WBC 5.7 8.6  HGB 12.5 13.0  HCT 38.3 39.2  PLT 240 168   BMET Recent Labs    08/07/22 0549 08/08/22 0602  NA 139 135  K  3.4* 3.6  CL 106 101  CO2 25 23  GLUCOSE 147* 116*  BUN 15 9  CREATININE 0.99 0.66  CALCIUM 8.8* 8.5*   LFT Recent Labs    08/08/22 0602  PROT 6.5  ALBUMIN 3.2*  AST 27  ALT 13  ALKPHOS 93  BILITOT 1.1   PT/INR No results for input(s): "LABPROT", "INR" in the last 72 hours. Hepatitis Panel No results for input(s): "HEPBSAG", "HCVAB", "HEPAIGM", "HEPBIGM" in the last 72 hours. C-Diff No results for input(s): "CDIFFTOX" in the last 72 hours. No results for input(s): "CDIFFPCR" in the last 72 hours.   Studies/Results: CT ABDOMEN PELVIS W CONTRAST  Result Date: 08/07/2022 CLINICAL DATA:  Pancreatitis EXAM: CT ABDOMEN AND PELVIS WITH CONTRAST TECHNIQUE: Multidetector CT imaging of the abdomen and pelvis was performed using the standard protocol following bolus administration of intravenous contrast. RADIATION DOSE REDUCTION: This exam was performed according to the departmental dose-optimization program which includes automated exposure control, adjustment of the mA and/or kV according to patient  size and/or use of iterative reconstruction technique. CONTRAST:  OMNIPAQUE IOHEXOL 300 MG/ML  SOLN COMPARISON:  CT abdomen and pelvis dated September 06, 2015 FINDINGS: Lower chest: No acute abnormality. Hepatobiliary: No focal liver abnormality is seen. No gallstones, gallbladder wall thickening, or biliary dilatation. Pancreas: Edematous pancreatic head with adjacent fat stranding and ill-defined fluid which extends inferiorly along the right paracolic gutter. Area of gland hypoenhancement involving the pancreatic head seen on series 5, image 41. No organized peripancreatic fluid collection.No evidence of main pancreatic duct dilation. Spleen: Normal in size without focal abnormality. Adrenals/Urinary Tract: Bilateral adrenal glands are unremarkable. No hydronephrosis or nephrolithiasis. Bladder is decompressed. Stomach/Bowel: Stomach is within normal limits. Appendix appears normal. Mild diverticulosis. No evidence of bowel wall thickening, distention, or inflammatory changes. Vascular/Lymphatic: No aortic atherosclerosis, mild atherosclerosis of the iliac arteries. No enlarged abdominal or pelvic lymph nodes. Reproductive: Uterus and bilateral adnexa are unremarkable. Other: No abdominal wall hernia or abnormality. Musculoskeletal: No acute or significant osseous findings. IMPRESSION: Findings consistent with acute pancreatitis. Focal area of hypoenhancement involving the pancreatic head, consistent with necrotizing pancreatitis. Recommend follow-up with MRCP after normalization of lipase or 1-2 months if lipase remains elevated. Electronically Signed   By: Allegra Lai M.D.   On: 08/07/2022 08:59    Scheduled Inpatient Medications:    enoxaparin (LOVENOX) injection  40 mg Subcutaneous Daily   folic acid  1 mg Oral Daily   multivitamin with minerals  1 tablet Oral Daily   nicotine  14 mg Transdermal Daily   thiamine  100 mg Oral Daily   Or   thiamine  100 mg Intravenous Daily    Continuous  Inpatient Infusions:    lactated ringers 150 mL/hr at 08/07/22 1753   promethazine (PHENERGAN) injection (IM or IVPB) 6.25 mg (08/08/22 0000)    PRN Inpatient Medications:  LORazepam **OR** LORazepam, morphine injection, ondansetron **OR** ondansetron (ZOFRAN) IV, promethazine (PHENERGAN) injection (IM or IVPB)  Miscellaneous: N/A  Assessment:  Acute necrotizing pancreatitis - wbc still ok, pain improved. Alcohol abuse Tobacco abuse.  Plan:  Continue IVF, pain control. Repeat CT A/P in 24-48 hrs to reassess. Continue NPO for now.  Dajohn Ellender K. Norma Fredrickson, M.D. 08/08/2022, 8:53 AM

## 2022-08-08 NOTE — Progress Notes (Signed)
[facility] Progress Note  Patient: Barbara Bates    ZOX:[id number]  DOA: 08/07/2022     Date of Service: the patient was seen and examined on 08/08/2022  Chief Complaint  Patient presents with   Abdominal Pain   Brief hospital course:  Barbara Bates is a 47 y.o. female with medical history significant of alcohol abuse and pancreatitis presenting with necrotizing pancreatitis.  Patient reports developing severe abdominal pain over the past 12 to 24 hours.  Abdominal pain generalized but predominately around the periumbilical area.  Positive nausea and vomiting.  No fevers or chills.  No hemiparesis or confusion.  No chest pain or shortness of breath.  Patient reports regular use of alcohol.  Drinks 2-3 beers daily as well as regular wine.  Last use was last night.  No reported illicit drug use.  1/2 pack/day smoker.  Has had a recurrent history of pancreatitis in the past.  Pain worsening prior. Presented to the ER afebrile, hemodynamically stable.  White count 5.7, hemoglobin 12.5, platelets 240, creatinine 0.99, potassium 3.4, AST 34, ALT 17, T. bili 0.5.  CT of the abdomen pelvis with findings concerning for acute pancreatitis with concern for necrotizing pancreatitis in the pancreatic head.  Dr. Norma Fredrickson with gastroenterology has been formally evaluated for consultation.   Assessment and Plan:  Necrotizing pancreatitis most likely due to heavy alcohol use Lipase 717--1063 Keep n.p.o., continue IV fluid Started PPI twice daily for GI prophylaxis Continue as needed medication for pain control GI following, Repeat CT in 48-72 hours to reassess degree of panceatic necrosis.    Hypomagnesemia, mag repleted. Monitor electrolytes and replete as needed.  ETOH abuse Patient reports 2-3 beers daily as well as wine No reported liquor use Alcohol level within normal limits CIWA protocol urine drug screen positive for opiates Discussed cessation at length Follow   Tobacco abuse  counseling 1/2 PPD smoker  Discussed cessation  Nicotine patch   Body mass index is 25.28 kg/m.  Interventions:    Diet: N.p.o. DVT Prophylaxis: Subcutaneous Lovenox   Advance goals of care discussion: Full code  Family Communication: family was not present at bedside, at the time of interview.  The pt provided permission to discuss medical plan with the family. Opportunity was given to ask question and all questions were answered satisfactorily.   Disposition:  Pt is from Home, admitted with necrotizing pancreatitis, still n.p.o. and on IVF, which precludes a safe discharge. Discharge to home, when clinically stable, may need 3 to 5 days to improve..  Subjective: No significant events overnight, patient is still having upper abdominal pain 8/10, feeling nauseous, no vomiting.  Does not feel hungry, no appetite.  Denies any chest pain or palpitations, no shortness of breath.  Denies any other active issues.  Physical Exam: General: NAD, lying comfortably Appear in no distress, affect appropriate Eyes: PERRLA ENT: Oral Mucosa Clear, moist  Neck: no JVD,  Cardiovascular: S1 and S2 Present, no Murmur,  Respiratory: good respiratory effort, Bilateral Air entry equal and Decreased, no Crackles, no wheezes Abdomen: Bowel Sound present, Soft, epigastric and upper abdominal tenderness  Skin: no rashes Extremities: no Pedal edema, no calf tenderness Neurologic: without any new focal findings Gait not checked due to patient safety concerns  Vitals:   08/07/22 1411 08/07/22 2039 08/08/22 0553 08/08/22 1238  BP: (!) 154/107 (!) 120/105 (!) 158/94 (!) 152/87  Pulse: 94 90 93 86  Resp: 15 16 18 16   Temp: 98 F (36.7 C) 98.3 F (  36.8 C) 98.3 F (36.8 C) 98.7 F (37.1 C)  TempSrc: Oral Oral  Oral  SpO2: 100% 98% 99% 100%  Weight: 66.8 kg     Height: 5\' 4"  (1.626 m)       Intake/Output Summary (Last 24 hours) at 08/08/2022 1353 Last data filed at 08/07/2022 1613 Gross per 24 hour   Intake --  Output 80 ml  Net -80 ml   Filed Weights   08/07/22 1411  Weight: 66.8 kg    Data Reviewed: I have personally reviewed and interpreted daily labs, tele strips, imagings as discussed above. I reviewed all nursing notes, pharmacy notes, vitals, pertinent old records I have discussed plan of care as described above with RN and patient/family.  CBC: Recent Labs  Lab 08/07/22 0549 08/08/22 0602  WBC 5.7 8.6  NEUTROABS 1.8  --   HGB 12.5 13.0  HCT 38.3 39.2  MCV 99.5 99.5  PLT 240 168   Basic Metabolic Panel: Recent Labs  Lab 08/07/22 0549 08/08/22 0602 08/08/22 0918  NA 139 135  --   K 3.4* 3.6  --   CL 106 101  --   CO2 25 23  --   GLUCOSE 147* 116*  --   BUN 15 9  --   CREATININE 0.99 0.66  --   CALCIUM 8.8* 8.5*  --   MG  --   --  1.5*  PHOS  --   --  3.6    Studies: No results found.  Scheduled Meds:  enoxaparin (LOVENOX) injection  40 mg Subcutaneous Daily   folic acid  1 mg Oral Daily   multivitamin with minerals  1 tablet Oral Daily   nicotine  14 mg Transdermal Daily   thiamine  100 mg Oral Daily   Or   thiamine  100 mg Intravenous Daily   Continuous Infusions:  lactated ringers 150 mL/hr at 08/08/22 1003   magnesium sulfate bolus IVPB 4 g (08/08/22 1340)   promethazine (PHENERGAN) injection (IM or IVPB) 6.25 mg (08/08/22 0000)   PRN Meds: LORazepam **OR** LORazepam, morphine injection, ondansetron **OR** ondansetron (ZOFRAN) IV, promethazine (PHENERGAN) injection (IM or IVPB)  Time spent: 35 minutes  Author: Gillis Santa. MD [facility] 08/08/2022 1:53 PM  To reach On-call, see care teams to locate the attending and reach out to them via [email or web address]. If 7PM-7AM, please contact night-coverage If you still have difficulty reaching the attending provider, please page the [facility] (Director on Call) for [facility] on amion for assistance.

## 2022-08-09 ENCOUNTER — Inpatient Hospital Stay: Payer: BC Managed Care – PPO

## 2022-08-09 DIAGNOSIS — K8591 Acute pancreatitis with uninfected necrosis, unspecified: Secondary | ICD-10-CM | POA: Diagnosis not present

## 2022-08-09 LAB — CBC
HCT: 39.1 % (ref 36.0–46.0)
Hemoglobin: 13.1 g/dL (ref 12.0–15.0)
MCH: 32.8 pg (ref 26.0–34.0)
MCHC: 33.5 g/dL (ref 30.0–36.0)
MCV: 97.8 fL (ref 80.0–100.0)
Platelets: 163 10*3/uL (ref 150–400)
RBC: 4 MIL/uL (ref 3.87–5.11)
RDW: 13 % (ref 11.5–15.5)
WBC: 12 10*3/uL — ABNORMAL HIGH (ref 4.0–10.5)
nRBC: 0 % (ref 0.0–0.2)

## 2022-08-09 LAB — BASIC METABOLIC PANEL
Anion gap: 8 (ref 5–15)
BUN: 8 mg/dL (ref 6–20)
CO2: 27 mmol/L (ref 22–32)
Calcium: 7.8 mg/dL — ABNORMAL LOW (ref 8.9–10.3)
Chloride: 97 mmol/L — ABNORMAL LOW (ref 98–111)
Creatinine, Ser: 0.9 mg/dL (ref 0.44–1.00)
GFR, Estimated: >60 mL/min (ref >60)
Glucose, Bld: 94 mg/dL (ref 70–99)
Potassium: 3.4 mmol/L — ABNORMAL LOW (ref 3.5–5.1)
Sodium: 132 mmol/L — ABNORMAL LOW (ref 135–145)

## 2022-08-09 LAB — MAGNESIUM: Magnesium: 2.1 mg/dL (ref 1.7–2.4)

## 2022-08-09 LAB — HEPATIC FUNCTION PANEL
ALT: 10 U/L (ref 0–44)
AST: 19 U/L (ref 15–41)
Albumin: 2.5 g/dL — ABNORMAL LOW (ref 3.5–5.0)
Alkaline Phosphatase: 79 U/L (ref 38–126)
Bilirubin, Direct: 0.2 mg/dL (ref 0.0–0.2)
Indirect Bilirubin: 0.8 mg/dL (ref 0.3–0.9)
Total Bilirubin: 1 mg/dL (ref 0.3–1.2)
Total Protein: 5.8 g/dL — ABNORMAL LOW (ref 6.5–8.1)

## 2022-08-09 LAB — LIPID PANEL
Cholesterol: 156 mg/dL (ref 0–200)
HDL: 39 mg/dL — ABNORMAL LOW (ref >40)
LDL Cholesterol: 100 mg/dL — ABNORMAL HIGH (ref 0–99)
Total CHOL/HDL Ratio: 4 RATIO
Triglycerides: 84 mg/dL (ref <150)
VLDL: 17 mg/dL (ref 0–40)

## 2022-08-09 LAB — PHOSPHORUS: Phosphorus: 3.4 mg/dL (ref 2.5–4.6)

## 2022-08-09 LAB — LIPASE, BLOOD: Lipase: 361 U/L — ABNORMAL HIGH (ref 11–51)

## 2022-08-09 MED ORDER — VITAMIN D (ERGOCALCIFEROL) 1.25 MG (50000 UNIT) PO CAPS
50000.0000 [IU] | ORAL_CAPSULE | ORAL | Status: DC
  Filled 2022-08-09: qty 1

## 2022-08-09 MED ORDER — IOHEXOL 300 MG/ML  SOLN
100.0000 mL | Freq: Once | INTRAMUSCULAR | Status: AC | PRN
  Administered 2022-08-09: 100 mL via INTRAVENOUS

## 2022-08-09 MED ORDER — IOHEXOL 9 MG/ML PO SOLN
500.0000 mL | ORAL | Status: AC
  Administered 2022-08-09 (×2): 500 mL via ORAL

## 2022-08-09 NOTE — Progress Notes (Signed)
[facility] Progress Note  Patient: Barbara Bates    ZOX:[id number]  DOA: 08/07/2022     Date of Service: the patient was seen and examined on 08/09/2022  Chief Complaint  Patient presents with   Abdominal Pain   Brief hospital course:  LADARIA POTT is a 47 y.o. female with medical history significant of alcohol abuse and pancreatitis presenting with necrotizing pancreatitis.  Patient reports developing severe abdominal pain over the past 12 to 24 hours.  Abdominal pain generalized but predominately around the periumbilical area.  Positive nausea and vomiting.  No fevers or chills.  No hemiparesis or confusion.  No chest pain or shortness of breath.  Patient reports regular use of alcohol.  Drinks 2-3 beers daily as well as regular wine.  Last use was last night.  No reported illicit drug use.  1/2 pack/day smoker.  Has had a recurrent history of pancreatitis in the past.  Pain worsening prior. Presented to the ER afebrile, hemodynamically stable.  White count 5.7, hemoglobin 12.5, platelets 240, creatinine 0.99, potassium 3.4, AST 34, ALT 17, T. bili 0.5.  CT of the abdomen pelvis with findings concerning for acute pancreatitis with concern for necrotizing pancreatitis in the pancreatic head.  Dr. Norma Fredrickson with gastroenterology has been formally evaluated for consultation.   Assessment and Plan:  Necrotizing pancreatitis most likely due to heavy alcohol use Lipase 717--1063--361 gradually trending down Triglyceride 84, within normal range Keep n.p.o., continue IV fluid Started PPI twice daily for GI prophylaxis Continue as needed medication for pain control GI following, Repeat CT in 48-72 hours to reassess degree of panceatic necrosis.  5/10 follow repeat CT scan, ordered by GI   Hypomagnesemia, mag repleted. Monitor electrolytes and replete as needed.  ETOH abuse Patient reports 2-3 beers daily as well as wine No reported liquor use Alcohol level within normal limits CIWA  protocol urine drug screen positive for opiates Discussed cessation at length Follow   Tobacco abuse counseling 1/2 PPD smoker  Discussed cessation  Nicotine patch   Vitamin D deficiency: started vitamin D 50,000 units p.o. weekly, follow with PCP to repeat vitamin D level after 3 to 6 months.  Body mass index is 25.28 kg/m.  Interventions:    Diet: N.p.o. DVT Prophylaxis: Subcutaneous Lovenox   Advance goals of care discussion: Full code  Family Communication: family was not present at bedside, at the time of interview.  The pt provided permission to discuss medical plan with the family. Opportunity was given to ask question and all questions were answered satisfactorily.   Disposition:  Pt is from Home, admitted with necrotizing pancreatitis, still n.p.o. and on IVF, which precludes a safe discharge. Discharge to home, when clinically stable, may need 3 to 5 days to improve..  Subjective: No significant events overnight, complaining of abdominal pain 8/10, overall feels improvement, nausea is better now.  Denies any dysuria. Denies any chest pain or palpitations, no shortness of breath.  Physical Exam: General: NAD, lying comfortably Appear in no distress, affect appropriate Eyes: PERRLA ENT: Oral Mucosa Clear, moist  Neck: no JVD,  Cardiovascular: S1 and S2 Present, no Murmur,  Respiratory: good respiratory effort, Bilateral Air entry equal and Decreased, no Crackles, no wheezes Abdomen: Bowel Sound present, Soft, generalized abdominal tenderness  Skin: no rashes Extremities: no Pedal edema, no calf tenderness Neurologic: without any new focal findings Gait not checked due to patient safety concerns  Vitals:   08/08/22 1646 08/08/22 2013 08/09/22 0358 08/09/22 0857  BP: Marland Kitchen)  155/91 (!) 149/99 133/87 (!) 136/96  Pulse: 90 (!) 104 (!) 107 (!) 103  Resp: 17 16 20 16   Temp: 99.2 F (37.3 C) 99.2 F (37.3 C) 99.5 F (37.5 C) 98.6 F (37 C)  TempSrc:  Oral Oral  Oral  SpO2: 100% 98% 96% 98%  Weight:      Height:       No intake or output data in the 24 hours ending 08/09/22 1558  Filed Weights   08/07/22 1411  Weight: 66.8 kg    Data Reviewed: I have personally reviewed and interpreted daily labs, tele strips, imagings as discussed above. I reviewed all nursing notes, pharmacy notes, vitals, pertinent old records I have discussed plan of care as described above with RN and patient/family.  CBC: Recent Labs  Lab 08/07/22 0549 08/08/22 0602 08/09/22 0555  WBC 5.7 8.6 12.0*  NEUTROABS 1.8  --   --   HGB 12.5 13.0 13.1  HCT 38.3 39.2 39.1  MCV 99.5 99.5 97.8  PLT 240 168 163   Basic Metabolic Panel: Recent Labs  Lab 08/07/22 0549 08/08/22 0602 08/08/22 0918 08/09/22 0555  NA 139 135  --  132*  K 3.4* 3.6  --  3.4*  CL 106 101  --  97*  CO2 25 23  --  27  GLUCOSE 147* 116*  --  94  BUN 15 9  --  8  CREATININE 0.99 0.66  --  0.90  CALCIUM 8.8* 8.5*  --  7.8*  MG  --   --  1.5* 2.1  PHOS  --   --  3.6 3.4    Studies: No results found.  Scheduled Meds:  enoxaparin (LOVENOX) injection  40 mg Subcutaneous Daily   folic acid  1 mg Oral Daily   multivitamin with minerals  1 tablet Oral Daily   nicotine  14 mg Transdermal Daily   pantoprazole (PROTONIX) IV  40 mg Intravenous Q12H   thiamine  100 mg Oral Daily   Or   thiamine  100 mg Intravenous Daily   Vitamin D (Ergocalciferol)  50,000 Units Oral Q7 days   Continuous Infusions:  lactated ringers 125 mL/hr at 08/09/22 0953   promethazine (PHENERGAN) injection (IM or IVPB) 6.25 mg (08/09/22 0335)   PRN Meds: LORazepam **OR** LORazepam, morphine injection, ondansetron **OR** ondansetron (ZOFRAN) IV, promethazine (PHENERGAN) injection (IM or IVPB)  Time spent: 35 minutes  Author: Gillis Santa. MD [facility] 08/09/2022 3:58 PM  To reach On-call, see care teams to locate the attending and reach out to them via [email or web address]. If 7PM-7AM, please contact  night-coverage If you still have difficulty reaching the attending provider, please page the [facility] (Director on Call) for [facility] on amion for assistance.

## 2022-08-09 NOTE — Plan of Care (Signed)

## 2022-08-09 NOTE — Progress Notes (Signed)
GI Inpatient Follow-up Note  Subjective:  Patient seen in follow-up for acute necrotizing pancreatitis. No acute events overnight. WBC up to 12.0K this morning, lipase down 361. She continues to endorse epigastric abdominal pain, rated 8/10 in severity. She hasn't had any further vomiting episodes this morning. IV Phenergan has been very helpful for nausea. She denies any fevers, chills, or rectal bleeding.   Scheduled Inpatient Medications:   enoxaparin (LOVENOX) injection  40 mg Subcutaneous Daily   folic acid  1 mg Oral Daily   multivitamin with minerals  1 tablet Oral Daily   nicotine  14 mg Transdermal Daily   pantoprazole (PROTONIX) IV  40 mg Intravenous Q12H   thiamine  100 mg Oral Daily   Or   thiamine  100 mg Intravenous Daily   Vitamin D (Ergocalciferol)  50,000 Units Oral Q7 days    Continuous Inpatient Infusions:    lactated ringers 125 mL/hr at 08/09/22 0953   promethazine (PHENERGAN) injection (IM or IVPB) 6.25 mg (08/09/22 0335)    PRN Inpatient Medications:  LORazepam **OR** LORazepam, morphine injection, ondansetron **OR** ondansetron (ZOFRAN) IV, promethazine (PHENERGAN) injection (IM or IVPB)  Review of Systems: Constitutional: Weight is stable.  Eyes: No changes in vision. ENT: No oral lesions, sore throat.  GI: see HPI.  Heme/Lymph: No easy bruising.  CV: No chest pain.  GU: No hematuria.  Integumentary: No rashes.  Neuro: No headaches.  Psych: No depression/anxiety.  Endocrine: No heat/cold intolerance.  Allergic/Immunologic: No urticaria.  Resp: No cough, SOB.  Musculoskeletal: No joint swelling.    Physical Examination: BP (!) 136/96 (BP Location: Left Arm)   Pulse (!) 103   Temp 98.6 F (37 C) (Oral)   Resp 16   Ht 5\' 4"  (1.626 m)   Wt 66.8 kg   LMP 12/19/2017 (Exact Date)   SpO2 98%   BMI 25.28 kg/m  Gen: NAD, alert and oriented x 4 HEENT: PEERLA, EOMI, Neck: supple, no JVD or thyromegaly Chest: CTA bilaterally, no wheezes,  crackles, or other adventitious sounds CV: RRR, no m/g/c/r Abd: soft, mildly distended, hypoactive BS in all four quadrants; tender to light and deep palpation in epigastrium, no HSM, guarding, ridigity, or rebound tenderness Ext: no edema, well perfused with 2+ pulses, Skin: no rash or lesions noted Lymph: no LAD  Data: Lab Results  Component Value Date   WBC 12.0 (H) 08/09/2022   HGB 13.1 08/09/2022   HCT 39.1 08/09/2022   MCV 97.8 08/09/2022   PLT 163 08/09/2022   Recent Labs  Lab 08/07/22 0549 08/08/22 0602 08/09/22 0555  HGB 12.5 13.0 13.1   Lab Results  Component Value Date   NA 132 (L) 08/09/2022   K 3.4 (L) 08/09/2022   CL 97 (L) 08/09/2022   CO2 27 08/09/2022   BUN 8 08/09/2022   CREATININE 0.90 08/09/2022   Lab Results  Component Value Date   ALT 10 08/09/2022   AST 19 08/09/2022   ALKPHOS 79 08/09/2022   BILITOT 1.0 08/09/2022   No results for input(s): "APTT", "INR", "PTT" in the last 168 hours.  CT abd/pelvis with contrast 08/07/2022: IMPRESSION: Findings consistent with acute pancreatitis. Focal area of hypoenhancement involving the pancreatic head, consistent with necrotizing pancreatitis. Recommend follow-up with MRCP after normalization of lipase or 1-2 months if lipase remains elevated.  Assessment/Plan:  47 y/o AA female with a PMH of hx of pancreatitis, hx of syphilis, tobacco abuse, and EtOH abuse admitted 5/8 for acute necrotizing pancreatitis.  Acute necrotizing pancreatitis   EtOH abuse  Tobacco abuse  Recommendations:  - Continue supportive care with IV antiemetics, pain control, and IV fluid hydration - Continue Protonix IV BID for gastric protection/GI prophylaxis  - Continue serial abdominal examinations  - Will order repeat CT abd/pelvis with contrast to reassess for improvement - Replete electrolytes as necessary per primary team  - NPO for now - Dr. Mia Creek will be following for GI over the weekend   Please call with  questions or concerns.  Jacob Moores, PA-C [facility] [phone]

## 2022-08-10 DIAGNOSIS — K8591 Acute pancreatitis with uninfected necrosis, unspecified: Secondary | ICD-10-CM | POA: Diagnosis not present

## 2022-08-10 LAB — MAGNESIUM: Magnesium: 2 mg/dL (ref 1.7–2.4)

## 2022-08-10 LAB — HEPATIC FUNCTION PANEL
ALT: 9 U/L (ref 0–44)
AST: 18 U/L (ref 15–41)
Albumin: 2.3 g/dL — ABNORMAL LOW (ref 3.5–5.0)
Alkaline Phosphatase: 90 U/L (ref 38–126)
Bilirubin, Direct: 0.2 mg/dL (ref 0.0–0.2)
Indirect Bilirubin: 1.2 mg/dL — ABNORMAL HIGH (ref 0.3–0.9)
Total Bilirubin: 1.4 mg/dL — ABNORMAL HIGH (ref 0.3–1.2)
Total Protein: 5.7 g/dL — ABNORMAL LOW (ref 6.5–8.1)

## 2022-08-10 LAB — CBC
HCT: 35.6 % — ABNORMAL LOW (ref 36.0–46.0)
Hemoglobin: 11.7 g/dL — ABNORMAL LOW (ref 12.0–15.0)
MCH: 32.3 pg (ref 26.0–34.0)
MCHC: 32.9 g/dL (ref 30.0–36.0)
MCV: 98.3 fL (ref 80.0–100.0)
Platelets: 160 10*3/uL (ref 150–400)
RBC: 3.62 MIL/uL — ABNORMAL LOW (ref 3.87–5.11)
RDW: 13.1 % (ref 11.5–15.5)
WBC: 10.9 10*3/uL — ABNORMAL HIGH (ref 4.0–10.5)
nRBC: 0 % (ref 0.0–0.2)

## 2022-08-10 LAB — GLUCOSE, CAPILLARY
Glucose-Capillary: 58 mg/dL — ABNORMAL LOW (ref 70–99)
Glucose-Capillary: 104 mg/dL — ABNORMAL HIGH (ref 70–99)

## 2022-08-10 LAB — BASIC METABOLIC PANEL
Anion gap: 7 (ref 5–15)
BUN: 12 mg/dL (ref 6–20)
CO2: 25 mmol/L (ref 22–32)
Calcium: 7.9 mg/dL — ABNORMAL LOW (ref 8.9–10.3)
Chloride: 99 mmol/L (ref 98–111)
Creatinine, Ser: 0.83 mg/dL (ref 0.44–1.00)
GFR, Estimated: >60 mL/min (ref >60)
Glucose, Bld: 64 mg/dL — ABNORMAL LOW (ref 70–99)
Potassium: 3.4 mmol/L — ABNORMAL LOW (ref 3.5–5.1)
Sodium: 131 mmol/L — ABNORMAL LOW (ref 135–145)

## 2022-08-10 LAB — LIPASE, BLOOD: Lipase: 79 U/L — ABNORMAL HIGH (ref 11–51)

## 2022-08-10 LAB — PHOSPHORUS: Phosphorus: 3.3 mg/dL (ref 2.5–4.6)

## 2022-08-10 MED ORDER — POTASSIUM CHLORIDE CRYS ER 20 MEQ PO TBCR
40.0000 meq | EXTENDED_RELEASE_TABLET | Freq: Once | ORAL | Status: AC
  Administered 2022-08-10: 40 meq via ORAL
  Filled 2022-08-10: qty 2

## 2022-08-10 MED ORDER — DEXTROSE IN LACTATED RINGERS 5 % IV SOLN: INTRAVENOUS | Status: DC

## 2022-08-10 MED ORDER — POLYETHYLENE GLYCOL 3350 17 G PO PACK
17.0000 g | PACK | Freq: Two times a day (BID) | ORAL | Status: DC
  Administered 2022-08-10 – 2022-08-13 (×2): 17 g via ORAL
  Filled 2022-08-10 (×6): qty 1

## 2022-08-10 MED ORDER — BISACODYL 10 MG RE SUPP: 10.0000 mg | Freq: Every day | RECTAL | Status: DC | PRN

## 2022-08-10 MED ORDER — BISACODYL 5 MG PO TBEC
10.0000 mg | DELAYED_RELEASE_TABLET | Freq: Every day | ORAL | Status: DC
  Administered 2022-08-10 – 2022-08-13 (×3): 10 mg via ORAL
  Filled 2022-08-10 (×4): qty 2

## 2022-08-10 MED ORDER — DEXTROSE 50 % IV SOLN
12.5000 g | INTRAVENOUS | Status: AC
  Administered 2022-08-10: 12.5 g via INTRAVENOUS
  Filled 2022-08-10: qty 50

## 2022-08-10 NOTE — Plan of Care (Signed)
  Problem: Health Behavior/Discharge Planning: Goal: Ability to manage health-related needs will improve Outcome: Progressing   Problem: Clinical Measurements: Goal: Ability to maintain clinical measurements within normal limits will improve Outcome: Progressing   Problem: Activity: Goal: Risk for activity intolerance will decrease Outcome: Progressing   Problem: Nutrition: Goal: Adequate nutrition will be maintained Outcome: Progressing   Problem: Coping: Goal: Level of anxiety will decrease Outcome: Progressing   Problem: Elimination: Goal: Will not experience complications related to bowel motility Outcome: Progressing   Problem: Pain Managment: Goal: General experience of comfort will improve Outcome: Progressing   

## 2022-08-10 NOTE — Progress Notes (Signed)
Hypoglycemic Event  CBG: 64  Treatment:   Symptoms: None  Follow-up CBG: Time:0450 CBG Result:64  Possible Reasons for Event: Inadequate meal intake  Comments/MD notified:pt's serum glucose at 0450 = 64; upon entering the pt's room at change of shift the pt was found lying in the bed; A&O asymptomatic for hypoglycemia; verbally instructed pt to call out if she started feeling any differently than she feels right now including shaky, clammy, sweating, etc.; pt verbalized that she would call    Bobetta Korf Luiz Blare

## 2022-08-10 NOTE — Progress Notes (Signed)
[facility] Progress Note  Patient: Barbara Bates    ZOX:[id number]  DOA: 08/07/2022     Date of Service: the patient was seen and examined on 08/10/2022  Chief Complaint  Patient presents with   Abdominal Pain   Brief hospital course:  PHILAMENA GERBERS is a 47 y.o. female with medical history significant of alcohol abuse and pancreatitis presenting with necrotizing pancreatitis.  Patient reports developing severe abdominal pain over the past 12 to 24 hours.  Abdominal pain generalized but predominately around the periumbilical area.  Positive nausea and vomiting.  No fevers or chills.  No hemiparesis or confusion.  No chest pain or shortness of breath.  Patient reports regular use of alcohol.  Drinks 2-3 beers daily as well as regular wine.  Last use was last night.  No reported illicit drug use.  1/2 pack/day smoker.  Has had a recurrent history of pancreatitis in the past.  Pain worsening prior. Presented to the ER afebrile, hemodynamically stable.  White count 5.7, hemoglobin 12.5, platelets 240, creatinine 0.99, potassium 3.4, AST 34, ALT 17, T. bili 0.5.  CT of the abdomen pelvis with findings concerning for acute pancreatitis with concern for necrotizing pancreatitis in the pancreatic head.  Dr. Norma Fredrickson with gastroenterology has been formally evaluated for consultation.   Assessment and Plan:  # Necrotizing pancreatitis most likely due to heavy alcohol use Lipase [id number]--[id number]--361 gradually trending down Triglyceride 84, within normal range Keep n.p.o., continue IV fluid Started PPI twice daily for GI prophylaxis Continue as needed medication for pain control GI following, Repeat CT in 48-72 hours to reassess degree of panceatic necrosis.  5/10 Ct a/p: 1.Findings compatible with worsening acute pancreatitis. There is increasing focal hypodensity involving the inferior pancreatic head and uncinate process worrisome for pancreatic necrosis. 2. New small volume ascites. 3. New small bilateral  pleural effusions with bibasilar atelectasis. 4. Fibroid uterus. 5/11 started clear liquid diet, patient was hungry and hypoglycemic   Hypoglycemia due to n.p.o. status, continue to monitor CBG and continue hypoglycemia protocol. Changed IV fluid to D5 LR  # Hyponatremia, monitor sodium level daily # Hypokalemia, potassium repleted. # Hypomagnesemia, mag repleted. Monitor electrolytes and replete as needed.  ETOH abuse Patient reports 2-3 beers daily as well as wine No reported liquor use Alcohol level within normal limits CIWA protocol urine drug screen positive for opiates Discussed cessation at length Follow   Tobacco abuse counseling 1/2 PPD smoker  Discussed cessation  Nicotine patch   Vitamin D deficiency: started vitamin D 50,000 units p.o. weekly, follow with PCP to repeat vitamin D level after 3 to 6 months.  Body mass index is 25.28 kg/m.  Interventions:    Diet: Clear liquid diet DVT Prophylaxis: Subcutaneous Lovenox   Advance goals of care discussion: Full code  Family Communication: family was not present at bedside, at the time of interview.  The pt provided permission to discuss medical plan with the family. Opportunity was given to ask question and all questions were answered satisfactorily.   Disposition:  Pt is from Home, admitted with necrotizing pancreatitis, still n.p.o. and on IVF, which precludes a safe discharge. Discharge to home, when clinically stable, may need 3 to 5 days to improve..  Subjective: No significant events overnight, c/o abd pain 8/10, patient feels hungry and would like to try clear liquid diet.  Patient was hypoglycemic, after hypoglycemia protocol patient feels a little bit better.  He denies any chest pain or palpitation, no shortness of breath.  Physical Exam: General: NAD, lying comfortably Appear in no distress, affect appropriate Eyes: PERRLA ENT: Oral Mucosa Clear, moist  Neck: no JVD,  Cardiovascular: S1 and S2  Present, no Murmur,  Respiratory: good respiratory effort, Bilateral Air entry equal and Decreased, no Crackles, no wheezes Abdomen: Bowel Sound present, Soft, generalized abdominal tenderness  Skin: no rashes Extremities: no Pedal edema, no calf tenderness Neurologic: without any new focal findings Gait not checked due to patient safety concerns  Vitals:   08/09/22 1600 08/09/22 2042 08/10/22 0422 08/10/22 0926  BP: (!) 141/98 (!) 138/92 (!) 135/95 131/89  Pulse: (!) 101 93 89 96  Resp:  18 18 16   Temp:  99.2 F (37.3 C) 98.7 F (37.1 C)   TempSrc:  Oral    SpO2:  94% 97% 98%  Weight:      Height:        Intake/Output Summary (Last 24 hours) at 08/10/2022 1458 Last data filed at 08/10/2022 1411 Gross per 24 hour  Intake 7188.85 ml  Output --  Net 7188.85 ml    Filed Weights   08/07/22 1411  Weight: 66.8 kg    Data Reviewed: I have personally reviewed and interpreted daily labs, tele strips, imagings as discussed above. I reviewed all nursing notes, pharmacy notes, vitals, pertinent old records I have discussed plan of care as described above with RN and patient/family.  CBC: Recent Labs  Lab 08/07/22 0549 08/08/22 0602 08/09/22 0555 08/10/22 0450  WBC 5.7 8.6 12.0* 10.9*  NEUTROABS 1.8  --   --   --   HGB 12.5 13.0 13.1 11.7*  HCT 38.3 39.2 39.1 35.6*  MCV 99.5 99.5 97.8 98.3  PLT 240 168 163 160   Basic Metabolic Panel: Recent Labs  Lab 08/07/22 0549 08/08/22 0602 08/08/22 0918 08/09/22 0555 08/10/22 0450  NA 139 135  --  132* 131*  K 3.4* 3.6  --  3.4* 3.4*  CL 106 101  --  97* 99  CO2 25 23  --  27 25  GLUCOSE 147* 116*  --  94 64*  BUN 15 9  --  8 12  CREATININE 0.99 0.66  --  0.90 0.83  CALCIUM 8.8* 8.5*  --  7.8* 7.9*  MG  --   --  1.5* 2.1 2.0  PHOS  --   --  3.6 3.4 3.3    Studies: CT ABDOMEN PELVIS W CONTRAST  Result Date: 08/09/2022 CLINICAL DATA:  Acute pancreatitis EXAM: CT ABDOMEN AND PELVIS WITH CONTRAST TECHNIQUE:  Multidetector CT imaging of the abdomen and pelvis was performed using the standard protocol following bolus administration of intravenous contrast. RADIATION DOSE REDUCTION: This exam was performed according to the departmental dose-optimization program which includes automated exposure control, adjustment of the mA and/or kV according to patient size and/or use of iterative reconstruction technique. CONTRAST:  OMNIPAQUE IOHEXOL 300 MG/ML  SOLN COMPARISON:  CT abdomen and pelvis 08/07/2022 FINDINGS: Lower chest: There are new small bilateral pleural effusions. There is new bibasilar atelectasis. Hepatobiliary: No focal liver abnormality is seen. No gallstones, gallbladder wall thickening, or biliary dilatation. Pancreas: Peripancreatic inflammation and fluid persists compatible with acute pancreatitis. This is increased when compared to the prior examination. There is increasing focal hypodensity involving the inferior pancreatic head and uncinate process worrisome for pancreatic necrosis. The neck, body and tail the pancreas appear within normal limits. No enhancing fluid collections are identified. There are small peripancreatic enhancing lymph nodes which are new from prior. Spleen:  Normal in size without focal abnormality. Adrenals/Urinary Tract: Adrenal glands are unremarkable. Kidneys are normal, without renal calculi, focal lesion, or hydronephrosis. Bladder is unremarkable. Stomach/Bowel: Stomach is within normal limits. Appendix appears normal. No evidence of bowel wall thickening, distention, or inflammatory changes. There is sigmoid colon diverticulosis. Vascular/Lymphatic: No significant vascular findings are present. No enlarged abdominal or pelvic lymph nodes. Reproductive: Uterus is lobulated and heterogeneous likely related to fibroid disease. The ovaries are not well delineated on this study. Other: There is new small volume ascites throughout the abdomen and pelvis. No focal abdominal wall  hernia. There is small amount of air in the subcutaneous tissues of the lower left anterior abdominal wall likely related to medication injection sites. Musculoskeletal: No acute or significant osseous findings. IMPRESSION: 1. Findings compatible with worsening acute pancreatitis. There is increasing focal hypodensity involving the inferior pancreatic head and uncinate process worrisome for pancreatic necrosis. 2. New small volume ascites. 3. New small bilateral pleural effusions with bibasilar atelectasis. 4. Fibroid uterus. Electronically Signed   By: Darliss Cheney M.D.   On: 08/09/2022 23:17    Scheduled Meds:  bisacodyl  10 mg Oral Daily   enoxaparin (LOVENOX) injection  40 mg Subcutaneous Daily   folic acid  1 mg Oral Daily   multivitamin with minerals  1 tablet Oral Daily   nicotine  14 mg Transdermal Daily   pantoprazole (PROTONIX) IV  40 mg Intravenous Q12H   polyethylene glycol  17 g Oral BID   thiamine  100 mg Oral Daily   Or   thiamine  100 mg Intravenous Daily   Vitamin D (Ergocalciferol)  50,000 Units Oral Q7 days   Continuous Infusions:  lactated ringers 125 mL/hr at 08/10/22 0950   promethazine (PHENERGAN) injection (IM or IVPB) 6.25 mg (08/09/22 0335)   PRN Meds: bisacodyl, morphine injection, ondansetron **OR** ondansetron (ZOFRAN) IV, promethazine (PHENERGAN) injection (IM or IVPB)  Time spent: 35 minutes  Author: Gillis Santa. MD [facility] 08/10/2022 2:58 PM  To reach On-call, see care teams to locate the attending and reach out to them via [email or web address]. If 7PM-7AM, please contact night-coverage If you still have difficulty reaching the attending provider, please page the [facility] (Director on Call) for [facility] on amion for assistance.

## 2022-08-10 NOTE — Progress Notes (Signed)
Hypoglycemic Event  CBG: 58 at 0927  Treatment: D50 25 mL (12.5 gm)  Symptoms: None  Follow-up CBG: Time:1010 CBG Result:104  Comments/MD notified:pt currently NPO except sips with medications and ice chips    Barbara Bates Luiz Blare

## 2022-08-11 DIAGNOSIS — K8591 Acute pancreatitis with uninfected necrosis, unspecified: Secondary | ICD-10-CM | POA: Diagnosis not present

## 2022-08-11 LAB — BASIC METABOLIC PANEL
Anion gap: 5 (ref 5–15)
BUN: 10 mg/dL (ref 6–20)
CO2: 28 mmol/L (ref 22–32)
Calcium: 7.9 mg/dL — ABNORMAL LOW (ref 8.9–10.3)
Chloride: 100 mmol/L (ref 98–111)
Creatinine, Ser: 0.89 mg/dL (ref 0.44–1.00)
GFR, Estimated: >60 mL/min (ref >60)
Glucose, Bld: 131 mg/dL — ABNORMAL HIGH (ref 70–99)
Potassium: 3.7 mmol/L (ref 3.5–5.1)
Sodium: 133 mmol/L — ABNORMAL LOW (ref 135–145)

## 2022-08-11 LAB — CBC
HCT: 32 % — ABNORMAL LOW (ref 36.0–46.0)
Hemoglobin: 10.6 g/dL — ABNORMAL LOW (ref 12.0–15.0)
MCH: 32.4 pg (ref 26.0–34.0)
MCHC: 33.1 g/dL (ref 30.0–36.0)
MCV: 97.9 fL (ref 80.0–100.0)
Platelets: 190 10*3/uL (ref 150–400)
RBC: 3.27 MIL/uL — ABNORMAL LOW (ref 3.87–5.11)
RDW: 12.7 % (ref 11.5–15.5)
WBC: 8.3 10*3/uL (ref 4.0–10.5)
nRBC: 0 % (ref 0.0–0.2)

## 2022-08-11 LAB — HEPATIC FUNCTION PANEL
ALT: 8 U/L (ref 0–44)
AST: 17 U/L (ref 15–41)
Albumin: 2.2 g/dL — ABNORMAL LOW (ref 3.5–5.0)
Alkaline Phosphatase: 111 U/L (ref 38–126)
Bilirubin, Direct: 0.2 mg/dL (ref 0.0–0.2)
Indirect Bilirubin: 0.6 mg/dL (ref 0.3–0.9)
Total Bilirubin: 0.8 mg/dL (ref 0.3–1.2)
Total Protein: 5.4 g/dL — ABNORMAL LOW (ref 6.5–8.1)

## 2022-08-11 LAB — LIPASE, BLOOD: Lipase: 44 U/L (ref 11–51)

## 2022-08-11 LAB — PHOSPHORUS: Phosphorus: 2.8 mg/dL (ref 2.5–4.6)

## 2022-08-11 LAB — MAGNESIUM: Magnesium: 2 mg/dL (ref 1.7–2.4)

## 2022-08-11 NOTE — Progress Notes (Signed)
[facility] Progress Note  Patient: Barbara Bates    NFA:[id number]  DOA: 08/07/2022     Date of Service: the patient was seen and examined on 08/11/2022  Chief Complaint  Patient presents with   Abdominal Pain   Brief hospital course:  Barbara Bates is a 47 y.o. female with medical history significant of alcohol abuse and pancreatitis presenting with necrotizing pancreatitis.  Patient reports developing severe abdominal pain over the past 12 to 24 hours.  Abdominal pain generalized but predominately around the periumbilical area.  Positive nausea and vomiting.  No fevers or chills.  No hemiparesis or confusion.  No chest pain or shortness of breath.  Patient reports regular use of alcohol.  Drinks 2-3 beers daily as well as regular wine.  Last use was last night.  No reported illicit drug use.  1/2 pack/day smoker.  Has had a recurrent history of pancreatitis in the past.  Pain worsening prior. Presented to the ER afebrile, hemodynamically stable.  White count 5.7, hemoglobin 12.5, platelets 240, creatinine 0.99, potassium 3.4, AST 34, ALT 17, T. bili 0.5.  CT of the abdomen pelvis with findings concerning for acute pancreatitis with concern for necrotizing pancreatitis in the pancreatic head.  Dr. Norma Fredrickson with gastroenterology has been formally evaluated for consultation.   Assessment and Plan:  # Necrotizing pancreatitis most likely due to heavy alcohol use Lipase 717--1063--361 gradually trending down Triglyceride 84, within normal range Keep n.p.o., continue IV fluid Started PPI twice daily for GI prophylaxis Continue as needed medication for pain control GI following, Repeat CT in 48-72 hours to reassess degree of panceatic necrosis.  5/10 Ct a/p: 1.Findings compatible with worsening acute pancreatitis. There is increasing focal hypodensity involving the inferior pancreatic head and uncinate process worrisome for pancreatic necrosis. 2. New small volume ascites. 3. New small bilateral  pleural effusions with bibasilar atelectasis. 4. Fibroid uterus. 5/12 advance diet to full liquid as per patient, she tolerated clear liquid diet.   Hypoglycemia due to n.p.o. status, continue to monitor CBG and continue hypoglycemia protocol. Changed IV fluid to D5 LR  # Hyponatremia, monitor sodium level daily # Hypokalemia, potassium repleted. # Hypomagnesemia, mag repleted. Monitor electrolytes and replete as needed.  ETOH abuse Patient reports 2-3 beers daily as well as wine No reported liquor use Alcohol level within normal limits CIWA protocol urine drug screen positive for opiates Discussed cessation at length Follow   Tobacco abuse counseling 1/2 PPD smoker  Discussed cessation  Nicotine patch   Vitamin D deficiency: started vitamin D 50,000 units p.o. weekly, follow with PCP to repeat vitamin D level after 3 to 6 months.  Body mass index is 25.28 kg/m.  Interventions:    Diet: Clear liquid diet DVT Prophylaxis: Subcutaneous Lovenox   Advance goals of care discussion: Full code  Family Communication: family was not present at bedside, at the time of interview.  The pt provided permission to discuss medical plan with the family. Opportunity was given to ask question and all questions were answered satisfactorily.   Disposition:  Pt is from Home, admitted with necrotizing pancreatitis, still n.p.o. and on IVF, which precludes a safe discharge. Discharge to home, when clinically stable, may need 3 to 5 days to improve..  Subjective: No significant events overnight, c/o abd pain 6/10, patient moved bowels 1 time today, feels improvement in the abdominal pain.  Patient tolerated clear liquid diet well, would like to advance to full liquid.  Denies any other complaints.  Physical  Exam: General: NAD, lying comfortably Appear in no distress, affect appropriate Eyes: PERRLA ENT: Oral Mucosa Clear, moist  Neck: no JVD,  Cardiovascular: S1 and S2 Present, no Murmur,   Respiratory: good respiratory effort, Bilateral Air entry equal and Decreased, no Crackles, no wheezes Abdomen: Bowel Sound present, Soft, generalized abdominal tenderness  Skin: no rashes Extremities: no Pedal edema, no calf tenderness Neurologic: without any new focal findings Gait not checked due to patient safety concerns  Vitals:   08/10/22 1621 08/10/22 2130 08/11/22 0601 08/11/22 0851  BP: (!) 151/95 120/86 121/86 135/89  Pulse: 97 (!) 105 100 95  Resp: 16 16 16 16   Temp:  99.1 F (37.3 C) 99.4 F (37.4 C) 98.8 F (37.1 C)  TempSrc:  Oral    SpO2: 95% 94% 97% 98%  Weight:      Height:        Intake/Output Summary (Last 24 hours) at 08/11/2022 1615 Last data filed at 08/11/2022 1426 Gross per 24 hour  Intake 3158.48 ml  Output --  Net 3158.48 ml    Filed Weights   08/07/22 1411  Weight: 66.8 kg    Data Reviewed: I have personally reviewed and interpreted daily labs, tele strips, imagings as discussed above. I reviewed all nursing notes, pharmacy notes, vitals, pertinent old records I have discussed plan of care as described above with RN and patient/family.  CBC: Recent Labs  Lab 08/07/22 0549 08/08/22 0602 08/09/22 0555 08/10/22 0450 08/11/22 0504  WBC 5.7 8.6 12.0* 10.9* 8.3  NEUTROABS 1.8  --   --   --   --   HGB 12.5 13.0 13.1 11.7* 10.6*  HCT 38.3 39.2 39.1 35.6* 32.0*  MCV 99.5 99.5 97.8 98.3 97.9  PLT 240 168 163 160 190   Basic Metabolic Panel: Recent Labs  Lab 08/07/22 0549 08/08/22 0602 08/08/22 0918 08/09/22 0555 08/10/22 0450 08/11/22 0504  NA 139 135  --  132* 131* 133*  K 3.4* 3.6  --  3.4* 3.4* 3.7  CL 106 101  --  97* 99 100  CO2 25 23  --  27 25 28   GLUCOSE 147* 116*  --  94 64* 131*  BUN 15 9  --  8 12 10   CREATININE 0.99 0.66  --  0.90 0.83 0.89  CALCIUM 8.8* 8.5*  --  7.8* 7.9* 7.9*  MG  --   --  1.5* 2.1 2.0 2.0  PHOS  --   --  3.6 3.4 3.3 2.8    Studies: No results found.  Scheduled Meds:  bisacodyl  10 mg Oral  Daily   enoxaparin (LOVENOX) injection  40 mg Subcutaneous Daily   folic acid  1 mg Oral Daily   multivitamin with minerals  1 tablet Oral Daily   nicotine  14 mg Transdermal Daily   pantoprazole (PROTONIX) IV  40 mg Intravenous Q12H   polyethylene glycol  17 g Oral BID   thiamine  100 mg Oral Daily   Or   thiamine  100 mg Intravenous Daily   Vitamin D (Ergocalciferol)  50,000 Units Oral Q7 days   Continuous Infusions:  dextrose 5% lactated ringers 100 mL/hr at 08/11/22 0922   promethazine (PHENERGAN) injection (IM or IVPB) 6.25 mg (08/09/22 0335)   PRN Meds: bisacodyl, morphine injection, ondansetron **OR** ondansetron (ZOFRAN) IV, promethazine (PHENERGAN) injection (IM or IVPB)  Time spent: 35 minutes  Author: Gillis Santa. MD [facility] 08/11/2022 4:15 PM  To reach On-call, see care teams to  locate the attending and reach out to them via [email or web address]. If 7PM-7AM, please contact night-coverage If you still have difficulty reaching the attending provider, please page the [facility] (Director on Call) for [facility] on amion for assistance.

## 2022-08-12 DIAGNOSIS — K8591 Acute pancreatitis with uninfected necrosis, unspecified: Secondary | ICD-10-CM | POA: Diagnosis not present

## 2022-08-12 LAB — BASIC METABOLIC PANEL
Anion gap: 8 (ref 5–15)
BUN: <5 mg/dL — ABNORMAL LOW (ref 6–20)
CO2: 25 mmol/L (ref 22–32)
Calcium: 8 mg/dL — ABNORMAL LOW (ref 8.9–10.3)
Chloride: 101 mmol/L (ref 98–111)
Creatinine, Ser: 0.82 mg/dL (ref 0.44–1.00)
GFR, Estimated: >60 mL/min (ref >60)
Glucose, Bld: 109 mg/dL — ABNORMAL HIGH (ref 70–99)
Potassium: 3.1 mmol/L — ABNORMAL LOW (ref 3.5–5.1)
Sodium: 134 mmol/L — ABNORMAL LOW (ref 135–145)

## 2022-08-12 LAB — PHOSPHORUS: Phosphorus: 3 mg/dL (ref 2.5–4.6)

## 2022-08-12 LAB — LIPASE, BLOOD: Lipase: 46 U/L (ref 11–51)

## 2022-08-12 LAB — CBC
HCT: 32.4 % — ABNORMAL LOW (ref 36.0–46.0)
Hemoglobin: 10.6 g/dL — ABNORMAL LOW (ref 12.0–15.0)
MCH: 32.4 pg (ref 26.0–34.0)
MCHC: 32.7 g/dL (ref 30.0–36.0)
MCV: 99.1 fL (ref 80.0–100.0)
Platelets: 202 10*3/uL (ref 150–400)
RBC: 3.27 MIL/uL — ABNORMAL LOW (ref 3.87–5.11)
RDW: 12.7 % (ref 11.5–15.5)
WBC: 5.3 10*3/uL (ref 4.0–10.5)
nRBC: 0 % (ref 0.0–0.2)

## 2022-08-12 LAB — MAGNESIUM: Magnesium: 1.8 mg/dL (ref 1.7–2.4)

## 2022-08-12 MED ORDER — POTASSIUM CHLORIDE CRYS ER 20 MEQ PO TBCR
40.0000 meq | EXTENDED_RELEASE_TABLET | ORAL | Status: AC
  Administered 2022-08-12 (×2): 40 meq via ORAL
  Filled 2022-08-12 (×2): qty 2

## 2022-08-12 NOTE — Progress Notes (Signed)
[facility] Progress Note  Patient: Barbara Bates    ZOX:[id number]  DOA: 08/07/2022     Date of Service: the patient was seen and examined on 08/12/2022  Chief Complaint  Patient presents with   Abdominal Pain   Brief hospital course:  Barbara Bates is a 47 y.o. female with medical history significant of alcohol abuse and pancreatitis presenting with necrotizing pancreatitis.  Patient reports developing severe abdominal pain over the past 12 to 24 hours.  Abdominal pain generalized but predominately around the periumbilical area.  Positive nausea and vomiting.  No fevers or chills.  No hemiparesis or confusion.  No chest pain or shortness of breath.  Patient reports regular use of alcohol.  Drinks 2-3 beers daily as well as regular wine.  Last use was last night.  No reported illicit drug use.  1/2 pack/day smoker.  Has had a recurrent history of pancreatitis in the past.  Pain worsening prior. Presented to the ER afebrile, hemodynamically stable.  White count 5.7, hemoglobin 12.5, platelets 240, creatinine 0.99, potassium 3.4, AST 34, ALT 17, T. bili 0.5.  CT of the abdomen pelvis with findings concerning for acute pancreatitis with concern for necrotizing pancreatitis in the pancreatic head.  Dr. Norma Fredrickson with gastroenterology has been formally evaluated for consultation.   Assessment and Plan:  # Necrotizing pancreatitis most likely due to heavy alcohol use Lipase [id number]--361 gradually trending down Triglyceride 84, within normal range Keep n.p.o., continue IV fluid Started PPI twice daily for GI prophylaxis Continue as needed medication for pain control GI following, Repeat CT in 48-72 hours to reassess degree of panceatic necrosis.  5/10 Ct a/p: 1.Findings compatible with worsening acute pancreatitis. There is increasing focal hypodensity involving the inferior pancreatic head and uncinate process worrisome for pancreatic necrosis. 2. New small volume ascites. 3. New small bilateral  pleural effusions with bibasilar atelectasis. 4. Fibroid uterus. 5/13 advance diet to heart healthy diet as per GI   Hypoglycemia due to n.p.o. status, continue to monitor CBG and continue hypoglycemia protocol. Changed IV fluid to D5 LR  # Hyponatremia, monitor sodium level daily # Hypokalemia, potassium repleted. # Hypomagnesemia, mag repleted. Monitor electrolytes and replete as needed.  # Constipation, started MiraLAX twice daily and Dulcolax daily.  ETOH abuse Patient reports 2-3 beers daily as well as wine No reported liquor use Alcohol level within normal limits CIWA protocol urine drug screen positive for opiates Discussed cessation at length Follow   Tobacco abuse counseling 1/2 PPD smoker  Discussed cessation  Nicotine patch   Vitamin D deficiency: started vitamin D 50,000 units p.o. weekly, follow with PCP to repeat vitamin D level after 3 to 6 months.  Body mass index is 25.28 kg/m.  Interventions:    Diet: Clear liquid diet DVT Prophylaxis: Subcutaneous Lovenox   Advance goals of care discussion: Full code  Family Communication: family was not present at bedside, at the time of interview.  The pt provided permission to discuss medical plan with the family. Opportunity was given to ask question and all questions were answered satisfactorily.   Disposition:  Pt is from Home, admitted with necrotizing pancreatitis, still on IVF, diet gradually advanced, still has significant pain which precludes a safe discharge. Discharge to home, when clinically stable, may need 1-2  days more to improve..  Subjective: No significant events overnight, c/o abd pain 6/10, no bowel movement today, patient had small bowel movement yesterday.  Still has some nausea, tolerated diet well.  RN informed that  patient had 1 episode of vomiting after advancing diet. Patient will be advised to eat small portion of diet. We will continue to monitor and discharge in 1 to 2  days.  Physical Exam: General: NAD, lying comfortably Appear in no distress, affect appropriate Eyes: PERRLA ENT: Oral Mucosa Clear, moist  Neck: no JVD,  Cardiovascular: S1 and S2 Present, no Murmur,  Respiratory: good respiratory effort, Bilateral Air entry equal and Decreased, no Crackles, no wheezes Abdomen: Bowel Sound present, Soft, generalized abdominal tenderness  Skin: no rashes Extremities: no Pedal edema, no calf tenderness Neurologic: without any new focal findings Gait not checked due to patient safety concerns  Vitals:   08/11/22 1958 08/11/22 2004 08/12/22 0555 08/12/22 0804  BP: 138/78  122/76 (!) 134/92  Pulse: 99  81 85  Resp: 18  18 17   Temp: 98.7 F (37.1 C)  99 F (37.2 C) 99.4 F (37.4 C)  TempSrc:      SpO2: (!) 87% 97% 98% 100%  Weight:      Height:        Intake/Output Summary (Last 24 hours) at 08/12/2022 1600 Last data filed at 08/11/2022 1851 Gross per 24 hour  Intake 971.57 ml  Output --  Net 971.57 ml    Filed Weights   08/07/22 1411  Weight: 66.8 kg    Data Reviewed: I have personally reviewed and interpreted daily labs, tele strips, imagings as discussed above. I reviewed all nursing notes, pharmacy notes, vitals, pertinent old records I have discussed plan of care as described above with RN and patient/family.  CBC: Recent Labs  Lab 08/07/22 0549 08/08/22 0602 08/09/22 0555 08/10/22 0450 08/11/22 0504 08/12/22 0517  WBC 5.7 8.6 12.0* 10.9* 8.3 5.3  NEUTROABS 1.8  --   --   --   --   --   HGB 12.5 13.0 13.1 11.7* 10.6* 10.6*  HCT 38.3 39.2 39.1 35.6* 32.0* 32.4*  MCV 99.5 99.5 97.8 98.3 97.9 99.1  PLT 240 168 163 160 190 202   Basic Metabolic Panel: Recent Labs  Lab 08/08/22 0602 08/08/22 0918 08/09/22 0555 08/10/22 0450 08/11/22 0504 08/12/22 0511 08/12/22 0517  NA 135  --  132* 131* 133*  --  134*  K 3.6  --  3.4* 3.4* 3.7  --  3.1*  CL 101  --  97* 99 100  --  101  CO2 23  --  27 25 28   --  25  GLUCOSE  116*  --  94 64* 131*  --  109*  BUN 9  --  8 12 10   --  <5*  CREATININE 0.66  --  0.90 0.83 0.89  --  0.82  CALCIUM 8.5*  --  7.8* 7.9* 7.9*  --  8.0*  MG  --  1.5* 2.1 2.0 2.0 1.8  --   PHOS  --  3.6 3.4 3.3 2.8 3.0  --     Studies: No results found.  Scheduled Meds:  bisacodyl  10 mg Oral Daily   enoxaparin (LOVENOX) injection  40 mg Subcutaneous Daily   folic acid  1 mg Oral Daily   multivitamin with minerals  1 tablet Oral Daily   nicotine  14 mg Transdermal Daily   pantoprazole (PROTONIX) IV  40 mg Intravenous Q12H   polyethylene glycol  17 g Oral BID   thiamine  100 mg Oral Daily   Or   thiamine  100 mg Intravenous Daily   Vitamin D (Ergocalciferol)  50,000  Units Oral Q7 days   Continuous Infusions:  dextrose 5% lactated ringers 100 mL/hr at 08/12/22 1003   promethazine (PHENERGAN) injection (IM or IVPB) 6.25 mg (08/09/22 0335)   PRN Meds: bisacodyl, morphine injection, ondansetron **OR** ondansetron (ZOFRAN) IV, promethazine (PHENERGAN) injection (IM or IVPB)  Time spent: 35 minutes  Author: Gillis Santa. MD [facility] 08/12/2022 4:00 PM  To reach On-call, see care teams to locate the attending and reach out to them via [email or web address]. If 7PM-7AM, please contact night-coverage If you still have difficulty reaching the attending provider, please page the [facility] (Director on Call) for [facility] on amion for assistance.

## 2022-08-12 NOTE — Progress Notes (Signed)
[facility] Gastroenterology Inpatient Progress Note    Subjective: Patient seen for f/u acute pancreatitis with necrosis. WBC now has normalized, patient is afebrile, wants to eat. Having loose bm's. Tolerated liquid diet.  Objective: Vital signs in last 24 hours: Temp:  [98.7 F (37.1 C)-100.4 F (38 C)] 99.4 F (37.4 C) (05/13 0804) Pulse Rate:  [80-99] 85 (05/13 0804) Resp:  [16-18] 17 (05/13 0804) BP: (122-138)/(76-92) 134/92 (05/13 0804) SpO2:  [87 %-100 %] 100 % (05/13 0804) Blood pressure (!) 134/92, pulse 85, temperature 99.4 F (37.4 C), resp. rate 17, height 5\' 4"  (1.626 m), weight 66.8 kg, last menstrual period 12/19/2017, SpO2 100 %.    Intake/Output from previous day: 05/12 0701 - 05/13 0700 In: 3570.3 [P.O.:1200; I.V.:2370.3] Out: -   Intake/Output this shift: No intake/output data recorded.   Gen: NAD. Appears comfortable.  HEENT: [place]/AT. PERRLA. Normal external ear exam.  Chest: CTA, no wheezes.  CV: RR nl S1, S2. No gallops.  Abd: soft,minimally tender in the epigastrium. nd. BS+  Ext: no edema. Pulses 2+  Neuro: Alert and oriented. Judgement appears normal. Nonfocal.   Lab Results: Results for orders placed or performed during the hospital encounter of 08/07/22 (from the past 24 hour(s))  Magnesium     Status: None   Collection Time: 08/12/22  5:11 AM  Result Value Ref Range   Magnesium 1.8 1.7 - 2.4 mg/dL  Phosphorus     Status: None   Collection Time: 08/12/22  5:11 AM  Result Value Ref Range   Phosphorus 3.0 2.5 - 4.6 mg/dL  Basic metabolic panel     Status: Abnormal   Collection Time: 08/12/22  5:17 AM  Result Value Ref Range   Sodium 134 (L) 135 - 145 mmol/L   Potassium 3.1 (L) 3.5 - 5.1 mmol/L   Chloride 101 98 - 111 mmol/L   CO2 25 22 - 32 mmol/L   Glucose, Bld 109 (H) 70 - 99 mg/dL   BUN <5 (L) 6 - 20 mg/dL   Creatinine, Ser 1.61 0.44 - 1.00 mg/dL   Calcium 8.0 (L) 8.9 - 10.3 mg/dL   GFR, Estimated >09 >60 mL/min   Anion  gap 8 5 - 15  CBC     Status: Abnormal   Collection Time: 08/12/22  5:17 AM  Result Value Ref Range   WBC 5.3 4.0 - 10.5 K/uL   RBC 3.27 (L) 3.87 - 5.11 MIL/uL   Hemoglobin 10.6 (L) 12.0 - 15.0 g/dL   HCT 45.4 (L) 09.8 - 11.9 %   MCV 99.1 80.0 - 100.0 fL   MCH 32.4 26.0 - 34.0 pg   MCHC 32.7 30.0 - 36.0 g/dL   RDW 14.7 82.9 - 56.2 %   Platelets 202 150 - 400 K/uL   nRBC 0.0 0.0 - 0.2 %  Lipase, blood     Status: None   Collection Time: 08/12/22  5:17 AM  Result Value Ref Range   Lipase 46 11 - 51 U/L     Recent Labs    08/10/22 0450 08/11/22 0504 08/12/22 0517  WBC 10.9* 8.3 5.3  HGB 11.7* 10.6* 10.6*  HCT 35.6* 32.0* 32.4*  PLT 160 190 202   BMET Recent Labs    08/10/22 0450 08/11/22 0504 08/12/22 0517  NA 131* 133* 134*  K 3.4* 3.7 3.1*  CL 99 100 101  CO2 25 28 25   GLUCOSE 64* 131* 109*  BUN 12 10 <5*  CREATININE 0.83 0.89 0.82  CALCIUM  7.9* 7.9* 8.0*   LFT Recent Labs    08/11/22 0504  PROT 5.4*  ALBUMIN 2.2*  AST 17  ALT 8  ALKPHOS 111  BILITOT 0.8  BILIDIR 0.2  IBILI 0.6   PT/INR No results for input(s): "LABPROT", "INR" in the last 72 hours. Hepatitis Panel No results for input(s): "HEPBSAG", "HCVAB", "HEPAIGM", "HEPBIGM" in the last 72 hours. C-Diff No results for input(s): "CDIFFTOX" in the last 72 hours. No results for input(s): "CDIFFPCR" in the last 72 hours.   Studies/Results: No results found.  Scheduled Inpatient Medications:    bisacodyl  10 mg Oral Daily   enoxaparin (LOVENOX) injection  40 mg Subcutaneous Daily   folic acid  1 mg Oral Daily   multivitamin with minerals  1 tablet Oral Daily   nicotine  14 mg Transdermal Daily   pantoprazole (PROTONIX) IV  40 mg Intravenous Q12H   polyethylene glycol  17 g Oral BID   thiamine  100 mg Oral Daily   Or   thiamine  100 mg Intravenous Daily   Vitamin D (Ergocalciferol)  50,000 Units Oral Q7 days    Continuous Inpatient Infusions:    dextrose 5% lactated ringers 100  mL/hr at 08/12/22 1003   promethazine (PHENERGAN) injection (IM or IVPB) 6.25 mg (08/09/22 0335)    PRN Inpatient Medications:  bisacodyl, morphine injection, ondansetron **OR** ondansetron (ZOFRAN) IV, promethazine (PHENERGAN) injection (IM or IVPB)  Miscellaneous: N/A  Assessment:  Acute necrotizing pancreatitis - Clinically improved. Tolerated liquids. Normal WBC. ETOH abuse  Plan:  Begin low fat diet. Continue daily CBC w/ diff, serial exams. Will continue to follow.  Natividad Schlosser K. Norma Fredrickson, M.D. 08/12/2022, 1:47 PM

## 2022-08-13 DIAGNOSIS — K8591 Acute pancreatitis with uninfected necrosis, unspecified: Secondary | ICD-10-CM | POA: Diagnosis not present

## 2022-08-13 LAB — CBC
HCT: 29.8 % — ABNORMAL LOW (ref 36.0–46.0)
Hemoglobin: 9.6 g/dL — ABNORMAL LOW (ref 12.0–15.0)
MCH: 32 pg (ref 26.0–34.0)
MCHC: 32.2 g/dL (ref 30.0–36.0)
MCV: 99.3 fL (ref 80.0–100.0)
Platelets: 250 10*3/uL (ref 150–400)
RBC: 3 MIL/uL — ABNORMAL LOW (ref 3.87–5.11)
RDW: 13 % (ref 11.5–15.5)
WBC: 5.7 10*3/uL (ref 4.0–10.5)
nRBC: 0 % (ref 0.0–0.2)

## 2022-08-13 LAB — MAGNESIUM: Magnesium: 1.7 mg/dL (ref 1.7–2.4)

## 2022-08-13 LAB — LIPASE, BLOOD: Lipase: 51 U/L (ref 11–51)

## 2022-08-13 LAB — BASIC METABOLIC PANEL
Anion gap: 5 (ref 5–15)
BUN: <5 mg/dL — ABNORMAL LOW (ref 6–20)
CO2: 26 mmol/L (ref 22–32)
Calcium: 8.1 mg/dL — ABNORMAL LOW (ref 8.9–10.3)
Chloride: 103 mmol/L (ref 98–111)
Creatinine, Ser: 0.94 mg/dL (ref 0.44–1.00)
GFR, Estimated: >60 mL/min (ref >60)
Glucose, Bld: 96 mg/dL (ref 70–99)
Potassium: 3.6 mmol/L (ref 3.5–5.1)
Sodium: 134 mmol/L — ABNORMAL LOW (ref 135–145)

## 2022-08-13 LAB — PHOSPHORUS: Phosphorus: 3.3 mg/dL (ref 2.5–4.6)

## 2022-08-13 MED ORDER — PANTOPRAZOLE SODIUM 40 MG PO TBEC: 40.0000 mg | DELAYED_RELEASE_TABLET | Freq: Every day | ORAL | 1 refills | Status: AC

## 2022-08-13 MED ORDER — VITAMIN B-1 100 MG PO TABS: 100.0000 mg | ORAL_TABLET | Freq: Every day | ORAL | 0 refills | Status: AC

## 2022-08-13 MED ORDER — ADULT MULTIVITAMIN W/MINERALS CH: 1.0000 | ORAL_TABLET | Freq: Every day | ORAL | 0 refills | Status: AC

## 2022-08-13 MED ORDER — POLYETHYLENE GLYCOL 3350 17 G PO PACK: 17.0000 g | PACK | Freq: Two times a day (BID) | ORAL | 0 refills | Status: AC

## 2022-08-13 MED ORDER — VITAMIN D (ERGOCALCIFEROL) 1.25 MG (50000 UNIT) PO CAPS: 50000.0000 [IU] | ORAL_CAPSULE | ORAL | 0 refills | Status: AC

## 2022-08-13 MED ORDER — FOLIC ACID 1 MG PO TABS: 1.0000 mg | ORAL_TABLET | Freq: Every day | ORAL | 0 refills | Status: AC

## 2022-08-13 NOTE — Discharge Summary (Signed)
[facility] Discharge Summary   Patient: Barbara Bates:[id number]  PCP: [facility]  Date of admission: 08/07/2022   Date of discharge:  08/13/2022     Discharge Diagnoses:  Principal Problem:   Necrotizing pancreatitis Active Problems:   ETOH abuse   Tobacco abuse counseling   Admitted From: Home Disposition:  Home   Recommendations for Outpatient Follow-up:  Follow with PCP in 1 week Follow with GI in 1 to 2 weeks Gradually advance diet as per tolerance.  Stop alcohol and smoking Follow up LABS/TEST:     Diet recommendation: low fiber soft diet  Activity: The patient is advised to gradually reintroduce usual activities, as tolerated  Discharge Condition: stable  Code Status: Full code   History of present illness: As per the H and P dictated on admission  Hospital Course:  Barbara Bates is a 47 y.o. female with medical history significant of alcohol abuse and pancreatitis presenting with necrotizing pancreatitis.  Patient reports developing severe abdominal pain over the past 12 to 24 hours.  Abdominal pain generalized but predominately around the periumbilical area.  Positive nausea and vomiting.  No fevers or chills.  No hemiparesis or confusion.  No chest pain or shortness of breath.  Patient reports regular use of alcohol.  Drinks 2-3 beers daily as well as regular wine.  Last use was last night.  No reported illicit drug use.  1/2 pack/day smoker.  Has had a recurrent history of pancreatitis in the past.  Pain worsening prior. Presented to the ER afebrile, hemodynamically stable.  White count 5.7, hemoglobin 12.5, platelets 240, creatinine 0.99, potassium 3.4, AST 34, ALT 17, T. bili 0.5.  CT of the abdomen pelvis with findings concerning for acute pancreatitis with concern for necrotizing pancreatitis in the pancreatic head.  Dr. Norma Fredrickson with gastroenterology has been formally evaluated for consultation.  Assessment and Plan:  #  Necrotizing pancreatitis most likely due to heavy alcohol use Lipase 717--1063--361--51 gradually trended down. Triglyceride 84, within normal range S/p PPI twice daily for GI prophylaxis. PRN medication for pain control.  GI was consulted.  Patient was kept n.p.o., gradually diet was advanced. Repeated CT in 48-72 hours to reassess degree of panceatic necrosis.  Patient was discharged on pantoprazole 40 mg p.o. daily. 5/10 Ct a/p: 1.Findings compatible with worsening acute pancreatitis. There is increasing focal hypodensity involving the inferior pancreatic head and uncinate process worrisome for pancreatic necrosis. 2. New small volume ascites. 3. New small bilateral pleural effusions with bibasilar atelectasis. 4. Fibroid uterus. Diet was gradually advanced, patient tolerated well.  On the day of discharge patient had 1 episode of vomiting and pain got worse but she decided to go home.  Patient was advised to slowly advance diet as per tolerance.  Follow-up with PCP and GI as an outpatient.   # Hypoglycemia due to n.p.o. status, s/p D5 LR IV fluid was given, hypoglycemia resolved.  Gradually diet was advanced.   # Hyponatremia, sodium 134, remained stable. # Hypokalemia, potassium repleted.  Resolved. # Hypomagnesemia, mag repleted.  Resolved. # Constipation, started MiraLAX twice daily and Dulcolax daily. # ETOH abuse, Patient reports 2-3 beers daily as well as wine. No reported liquor use Alcohol level within normal limits. S/p CIWA protocol, no withdrawal symptoms. urine drug screen positive for opiates. Discussed cessation at length.  Patient was discharged on multivitamin, thiamine and folic acid.  [place]ane # Tobacco abuse counseling, 1/2 PPD smoker. Discussed cessation. S/p Nicotine patch  #  Vitamin D deficiency: started vitamin D 50,000 units p.o. weekly, follow with PCP to repeat vitamin D level after 3 to 6 months.  Body mass index is 25.28 kg/m.  Nutrition Interventions:   - Patient was  instructed, not to drive, operate heavy machinery, perform activities at heights, swimming or participation in water activities or provide baby sitting services while on Pain, Sleep and Anxiety Medications; until her outpatient Physician has advised to do so again.  - Also recommended to not to take more than prescribed Pain, Sleep and Anxiety Medications.  Patient was ambulatory without any assistance. On the day of the discharge the patient's vitals were stable, and no other acute medical condition were reported by patient. the patient was felt safe to be discharge at Home.  Consultants: GI Procedures: None  Discharge Exam: General: Appear in no distress, no Rash; Oral Mucosa Clear, moist. Cardiovascular: S1 and S2 Present, no Murmur, Respiratory: normal respiratory effort, Bilateral Air entry present and no Crackles, no wheezes Abdomen: Bowel Sound present, Soft and no tenderness, no hernia Extremities: no Pedal edema, no calf tenderness Neurology: alert and oriented to time, place, and person affect appropriate.  Filed Weights   08/07/22 1411  Weight: 66.8 kg   Vitals:   08/13/22 0428 08/13/22 0900  BP: (!) 163/92 (!) 142/93  Pulse: 88 88  Resp: 18 16  Temp: 98.4 F (36.9 C) 98.8 F (37.1 C)  SpO2: 99% 100%    DISCHARGE MEDICATION: Allergies as of 08/13/2022   No Known Allergies      Medication List     TAKE these medications    acetaminophen 500 MG tablet Commonly known as: TYLENOL Take 500 mg by mouth every 6 (six) hours as needed for mild pain, moderate pain, fever or headache.   folic acid 1 MG tablet Commonly known as: FOLVITE Take 1 tablet (1 mg total) by mouth daily. Start taking on: Aug 14, 2022   multivitamin with minerals Tabs tablet Take 1 tablet by mouth daily. Start taking on: Aug 14, 2022   naproxen sodium 220 MG tablet Commonly known as: ALEVE Take 220 mg by mouth daily as needed.   pantoprazole 40 MG tablet Commonly known as:  Protonix Take 1 tablet (40 mg total) by mouth daily.   polyethylene glycol 17 g packet Commonly known as: MIRALAX / GLYCOLAX Take 17 g by mouth 2 (two) times daily.   thiamine 100 MG tablet Commonly known as: Vitamin B-1 Take 1 tablet (100 mg total) by mouth daily. Start taking on: Aug 14, 2022   Vitamin D (Ergocalciferol) 1.25 MG (50000 UNIT) Caps capsule Commonly known as: DRISDOL Take 1 capsule (50,000 Units total) by mouth every 7 (seven) days. Start taking on: Aug 16, 2022       No Known Allergies Discharge Instructions     Call MD for:  difficulty breathing, headache or visual disturbances   Complete by: As directed    Call MD for:  extreme fatigue   Complete by: As directed    Call MD for:  persistant dizziness or light-headedness   Complete by: As directed    Call MD for:  persistant nausea and vomiting   Complete by: As directed    Call MD for:  severe uncontrolled pain   Complete by: As directed    Call MD for:  temperature >100.4   Complete by: As directed    Diet - low sodium heart healthy   Complete by: As directed    Discharge  instructions   Complete by: As directed    Follow with PCP in 1 week Follow with GI in 1 to 2 weeks Gradually advance diet as per tolerance.  Stop alcohol and smoking.   Increase activity slowly   Complete by: As directed        The results of significant diagnostics from this hospitalization (including imaging, microbiology, ancillary and laboratory) are listed below for reference.    Significant Diagnostic Studies: CT ABDOMEN PELVIS W CONTRAST  Result Date: 08/09/2022 CLINICAL DATA:  Acute pancreatitis EXAM: CT ABDOMEN AND PELVIS WITH CONTRAST TECHNIQUE: Multidetector CT imaging of the abdomen and pelvis was performed using the standard protocol following bolus administration of intravenous contrast. RADIATION DOSE REDUCTION: This exam was performed according to the departmental dose-optimization program which includes  automated exposure control, adjustment of the mA and/or kV according to patient size and/or use of iterative reconstruction technique. CONTRAST:  OMNIPAQUE IOHEXOL 300 MG/ML  SOLN COMPARISON:  CT abdomen and pelvis 08/07/2022 FINDINGS: Lower chest: There are new small bilateral pleural effusions. There is new bibasilar atelectasis. Hepatobiliary: No focal liver abnormality is seen. No gallstones, gallbladder wall thickening, or biliary dilatation. Pancreas: Peripancreatic inflammation and fluid persists compatible with acute pancreatitis. This is increased when compared to the prior examination. There is increasing focal hypodensity involving the inferior pancreatic head and uncinate process worrisome for pancreatic necrosis. The neck, body and tail the pancreas appear within normal limits. No enhancing fluid collections are identified. There are small peripancreatic enhancing lymph nodes which are new from prior. Spleen: Normal in size without focal abnormality. Adrenals/Urinary Tract: Adrenal glands are unremarkable. Kidneys are normal, without renal calculi, focal lesion, or hydronephrosis. Bladder is unremarkable. Stomach/Bowel: Stomach is within normal limits. Appendix appears normal. No evidence of bowel wall thickening, distention, or inflammatory changes. There is sigmoid colon diverticulosis. Vascular/Lymphatic: No significant vascular findings are present. No enlarged abdominal or pelvic lymph nodes. Reproductive: Uterus is lobulated and heterogeneous likely related to fibroid disease. The ovaries are not well delineated on this study. Other: There is new small volume ascites throughout the abdomen and pelvis. No focal abdominal wall hernia. There is small amount of air in the subcutaneous tissues of the lower left anterior abdominal wall likely related to medication injection sites. Musculoskeletal: No acute or significant osseous findings. IMPRESSION: 1. Findings compatible with worsening acute  pancreatitis. There is increasing focal hypodensity involving the inferior pancreatic head and uncinate process worrisome for pancreatic necrosis. 2. New small volume ascites. 3. New small bilateral pleural effusions with bibasilar atelectasis. 4. Fibroid uterus. Electronically Signed   By: Darliss Cheney M.D.   On: 08/09/2022 23:17   CT ABDOMEN PELVIS W CONTRAST  Result Date: 08/07/2022 CLINICAL DATA:  Pancreatitis EXAM: CT ABDOMEN AND PELVIS WITH CONTRAST TECHNIQUE: Multidetector CT imaging of the abdomen and pelvis was performed using the standard protocol following bolus administration of intravenous contrast. RADIATION DOSE REDUCTION: This exam was performed according to the departmental dose-optimization program which includes automated exposure control, adjustment of the mA and/or kV according to patient size and/or use of iterative reconstruction technique. CONTRAST:  OMNIPAQUE IOHEXOL 300 MG/ML  SOLN COMPARISON:  CT abdomen and pelvis dated September 06, 2015 FINDINGS: Lower chest: No acute abnormality. Hepatobiliary: No focal liver abnormality is seen. No gallstones, gallbladder wall thickening, or biliary dilatation. Pancreas: Edematous pancreatic head with adjacent fat stranding and ill-defined fluid which extends inferiorly along the right paracolic gutter. Area of gland hypoenhancement involving the pancreatic head seen on  series 5, image 41. No organized peripancreatic fluid collection.No evidence of main pancreatic duct dilation. Spleen: Normal in size without focal abnormality. Adrenals/Urinary Tract: Bilateral adrenal glands are unremarkable. No hydronephrosis or nephrolithiasis. Bladder is decompressed. Stomach/Bowel: Stomach is within normal limits. Appendix appears normal. Mild diverticulosis. No evidence of bowel wall thickening, distention, or inflammatory changes. Vascular/Lymphatic: No aortic atherosclerosis, mild atherosclerosis of the iliac arteries. No enlarged abdominal or pelvic lymph  nodes. Reproductive: Uterus and bilateral adnexa are unremarkable. Other: No abdominal wall hernia or abnormality. Musculoskeletal: No acute or significant osseous findings. IMPRESSION: Findings consistent with acute pancreatitis. Focal area of hypoenhancement involving the pancreatic head, consistent with necrotizing pancreatitis. Recommend follow-up with MRCP after normalization of lipase or 1-2 months if lipase remains elevated. Electronically Signed   By: Allegra Lai M.D.   On: 08/07/2022 08:59    Microbiology: No results found for this or any previous visit (from the past 240 hour(s)).   Labs: CBC: Recent Labs  Lab 08/07/22 0549 08/08/22 0602 08/09/22 0555 08/10/22 0450 08/11/22 0504 08/12/22 0517 08/13/22 0620  WBC 5.7   < > 12.0* 10.9* 8.3 5.3 5.7  NEUTROABS 1.8  --   --   --   --   --   --   HGB 12.5   < > 13.1 11.7* 10.6* 10.6* 9.6*  HCT 38.3   < > 39.1 35.6* 32.0* 32.4* 29.8*  MCV 99.5   < > 97.8 98.3 97.9 99.1 99.3  PLT 240   < > 163 160 190 202 250   < > = values in this interval not displayed.   Basic Metabolic Panel: Recent Labs  Lab 08/09/22 0555 08/10/22 0450 08/11/22 0504 08/12/22 0511 08/12/22 0517 08/13/22 0620  NA 132* 131* 133*  --  134* 134*  K 3.4* 3.4* 3.7  --  3.1* 3.6  CL 97* 99 100  --  101 103  CO2 27 25 28   --  25 26  GLUCOSE 94 64* 131*  --  109* 96  BUN 8 12 10   --  <5* <5*  CREATININE 0.90 0.83 0.89  --  0.82 0.94  CALCIUM 7.8* 7.9* 7.9*  --  8.0* 8.1*  MG 2.1 2.0 2.0 1.8  --  1.7  PHOS 3.4 3.3 2.8 3.0  --  3.3   Liver Function Tests: Recent Labs  Lab 08/07/22 0549 08/08/22 0602 08/09/22 0555 08/10/22 0450 08/11/22 0504  AST 34 27 19 18 17   ALT 17 13 10 9 8   ALKPHOS 119 93 79 90 111  BILITOT 0.5 1.1 1.0 1.4* 0.8  PROT 7.3 6.5 5.8* 5.7* 5.4*  ALBUMIN 3.6 3.2* 2.5* 2.3* 2.2*   Recent Labs  Lab 08/09/22 0555 08/10/22 0450 08/11/22 0504 08/12/22 0517 08/13/22 0620  LIPASE 361* 79* 44 46 51   No results for input(s):  "AMMONIA" in the last 168 hours. Cardiac Enzymes: No results for input(s): "CKTOTAL", "CKMB", "CKMBINDEX", "TROPONINI" in the last 168 hours. BNP (last 3 results) No results for input(s): "BNP" in the last 8760 hours. CBG: Recent Labs  Lab 08/10/22 0927 08/10/22 1010  GLUCAP 58* 104*    Time spent: 35 minutes  Signed:  Gillis Santa  [facility] 08/13/2022 12:24 PM

## 2022-08-13 NOTE — Plan of Care (Signed)

## 2022-08-13 NOTE — Progress Notes (Signed)
Patient being discharged home. PIV removed. Went over discharge instructions, resources, appointments and medications with patients. All questions were answered and patient states that she understands. Patient going home POV.

## 2023-02-05 ENCOUNTER — Other Ambulatory Visit: Payer: Self-pay | Admitting: Family Medicine

## 2023-02-05 DIAGNOSIS — Z1231 Encounter for screening mammogram for malignant neoplasm of breast: Secondary | ICD-10-CM

## 2023-03-02 ENCOUNTER — Emergency Department
Admission: EM | Admit: 2023-03-02 | Discharge: 2023-03-02 | Disposition: A | Discharge status: Home / Self Care | Payer: BC Managed Care – PPO | Attending: Emergency Medicine | Admitting: Emergency Medicine

## 2023-03-02 ENCOUNTER — Encounter: Payer: Self-pay | Admitting: Emergency Medicine

## 2023-03-02 ENCOUNTER — Emergency Department: Payer: BC Managed Care – PPO

## 2023-03-02 ENCOUNTER — Other Ambulatory Visit: Payer: Self-pay

## 2023-03-02 DIAGNOSIS — Z20822 Contact with and (suspected) exposure to covid-19: Secondary | ICD-10-CM | POA: Diagnosis not present

## 2023-03-02 DIAGNOSIS — J069 Acute upper respiratory infection, unspecified: Secondary | ICD-10-CM | POA: Diagnosis not present

## 2023-03-02 DIAGNOSIS — R059 Cough, unspecified: Secondary | ICD-10-CM | POA: Diagnosis present

## 2023-03-02 LAB — RESP PANEL BY RT-PCR (RSV, FLU A&B, COVID)  RVPGX2
Influenza A by PCR: NEGATIVE
Influenza B by PCR: NEGATIVE
Resp Syncytial Virus by PCR: NEGATIVE
SARS Coronavirus 2 by RT PCR: NEGATIVE

## 2023-03-02 MED ORDER — IPRATROPIUM-ALBUTEROL 0.5-2.5 (3) MG/3ML IN SOLN
3.0000 mL | Freq: Once | RESPIRATORY_TRACT | Status: AC
  Administered 2023-03-02: 3 mL via RESPIRATORY_TRACT
  Filled 2023-03-02: qty 3

## 2023-03-02 MED ORDER — ACETAMINOPHEN 325 MG PO TABS
650.0000 mg | ORAL_TABLET | Freq: Once | ORAL | Status: AC
  Administered 2023-03-02: 650 mg via ORAL
  Filled 2023-03-02: qty 2

## 2023-03-02 NOTE — ED Provider Notes (Signed)
[facility] Provider Note    Event Date/Time   First MD Initiated Contact with Patient 03/02/23 1235     (approximate)   History   Cough   HPI  Barbara Bates is a 47 y.o. female with a past medical history of pancreatitis and alcohol abuse who presents today for evaluation of cough, nasal congestion, fatigue, body aches for the past 4 days.  Patient reports that her cough is productive.  She denies chest pain or shortness of breath.  No abdominal pain, nausea, vomiting, diarrhea.  She is unsure of any sick contacts.  Patient Active Problem List   Diagnosis Date Noted   Necrotizing pancreatitis 08/07/2022   ETOH abuse 08/07/2022   Tobacco abuse counseling 08/07/2022   Malnutrition of moderate degree 12/22/2017   Pancreatitis 09/06/2015          Physical Exam   Triage Vital Signs: ED Triage Vitals  Encounter Vitals Group     BP 03/02/23 1232 (!) 143/101     Systolic BP Percentile --      Diastolic BP Percentile --      Pulse Rate 03/02/23 1232 100     Resp 03/02/23 1232 20     Temp 03/02/23 1232 98.7 F (37.1 C)     Temp Source 03/02/23 1232 Oral     SpO2 03/02/23 1232 100 %     Weight 03/02/23 1230 135 lb (61.2 kg)     Height 03/02/23 1230 5\' 6"  (1.676 m)     Head Circumference --      Peak Flow --      Pain Score --      Pain Loc --      Pain Education --      Exclude from Growth Chart --     Most recent vital signs: Vitals:   03/02/23 1232 03/02/23 1346  BP: (!) 143/101 138/88  Pulse: 100 88  Resp: 20 18  Temp: 98.7 F (37.1 C)   SpO2: 100% 100%    Physical Exam Vitals and nursing note reviewed.  Constitutional:      General: Awake and alert. No acute distress.    Appearance: Normal appearance. The patient is normal weight.  HENT:     Head: Normocephalic and atraumatic.     Mouth: Mucous membranes are moist.  Nasal congestion present Eyes:     General: PERRL. Normal EOMs        Right eye: No discharge.        Left  eye: No discharge.     Conjunctiva/sclera: Conjunctivae normal.  Cardiovascular:     Rate and Rhythm: Normal rate and regular rhythm.     Pulses: Normal pulses.  Pulmonary:     Effort: Pulmonary effort is normal. No respiratory distress.  No accessory muscle use.  No increased work of breathing.    Breath sounds: Normal breath sounds.  Abdominal:     Abdomen is soft. There is no abdominal tenderness. No rebound or guarding. No distention. Musculoskeletal:        General: No swelling. Normal range of motion.     Cervical back: Normal range of motion and neck supple.  Skin:    General: Skin is warm and dry.     Capillary Refill: Capillary refill takes less than 2 seconds.     Findings: No rash.  Neurological:     Mental Status: The patient is awake and alert.      ED Results / Procedures /  Treatments   Labs (all labs ordered are listed, but only abnormal results are displayed) Labs Reviewed  RESP PANEL BY RT-PCR (RSV, FLU A&B, COVID)  [id number]     EKG     RADIOLOGY I independently reviewed and interpreted imaging and agree with radiologists findings.     PROCEDURES:  Critical Care performed:   Procedures   MEDICATIONS ORDERED IN ED: Medications  acetaminophen (TYLENOL) tablet 650 mg (650 mg Oral Given 03/02/23 1246)  ipratropium-albuterol (DUONEB) 0.5-2.5 (3) MG/3ML nebulizer solution 3 mL (3 mLs Nebulization Given 03/02/23 1300)     IMPRESSION / MDM / ASSESSMENT AND PLAN / ED COURSE  I reviewed the triage vital signs and the nursing notes.   Differential diagnosis includes, but is not limited to, COVID, flu, RSV, pneumonia, bronchitis.  Patient is awake and alert, hemodynamically stable and afebrile.  She is nontoxic in appearance.  She has normal oxygen saturation of 100% on room air.  She demonstrates no increased work of breathing.  COVID, flu, RSV swab obtained in triage is negative.  Chest x-ray does not reveal evidence of pneumonia.  She was treated with  DuoNebs with improvement of her cough/bronchospasm.  She is reassured by her results today.  We discussed return precautions and the importance of close outpatient follow-up.  Patient understands and agrees with plan.  She was discharged in stable condition.   Patient's presentation is most consistent with acute complicated illness / injury requiring diagnostic workup.    FINAL CLINICAL IMPRESSION(S) / ED DIAGNOSES   Final diagnoses:  Viral URI with cough     Rx / DC Orders   ED Discharge Orders     None        Note:  This document was prepared using Dragon voice recognition software and may include unintentional dictation errors.   Jackelyn Hoehn, PA-C 03/02/23 1407    Janith Lima, MD 03/02/23 [phone]

## 2023-03-02 NOTE — Discharge Instructions (Addendum)
Your chest x-ray does not reveal pneumonia.  Your COVID, flu, RSV swab was negative.  You likely have another viral etiology.  You may continue to take Tylenol/ibuprofen per package instructions.  Please follow-up with your outpatient provider.  Please return for any new, worsening, or change in symptoms or other concerns.  It was a pleasure caring for you today.

## 2023-03-02 NOTE — ED Triage Notes (Signed)
Pt via POV from home. Pt c/o cough, fatigue, body aches, and headache for the past 4 days. OTC medication has not made her feel any better. Denies sick contacts. Pt is A&Ox4 and NAD

## 2023-04-21 ENCOUNTER — Other Ambulatory Visit: Payer: Self-pay

## 2023-04-21 ENCOUNTER — Telehealth: Payer: Self-pay | Admitting: Emergency Medicine

## 2023-04-21 ENCOUNTER — Emergency Department: Payer: BC Managed Care – PPO

## 2023-04-21 ENCOUNTER — Emergency Department
Admission: EM | Admit: 2023-04-21 | Discharge: 2023-04-21 | Disposition: A | Discharge status: Home / Self Care | Payer: BC Managed Care – PPO | Attending: Emergency Medicine | Admitting: Emergency Medicine

## 2023-04-21 DIAGNOSIS — J101 Influenza due to other identified influenza virus with other respiratory manifestations: Secondary | ICD-10-CM | POA: Diagnosis not present

## 2023-04-21 DIAGNOSIS — Z20822 Contact with and (suspected) exposure to covid-19: Secondary | ICD-10-CM | POA: Insufficient documentation

## 2023-04-21 DIAGNOSIS — R059 Cough, unspecified: Secondary | ICD-10-CM | POA: Diagnosis present

## 2023-04-21 LAB — RESP PANEL BY RT-PCR (RSV, FLU A&B, COVID)  RVPGX2
Influenza A by PCR: POSITIVE — AB
Influenza B by PCR: NEGATIVE
Resp Syncytial Virus by PCR: NEGATIVE
SARS Coronavirus 2 by RT PCR: NEGATIVE

## 2023-04-21 MED ORDER — GUAIFENESIN-CODEINE 100-10 MG/5ML PO SOLN: 10.0000 mL | Freq: Four times a day (QID) | ORAL | 0 refills | Status: AC | PRN

## 2023-04-21 MED ORDER — GUAIFENESIN-CODEINE 100-10 MG/5ML PO SOLN: 10.0000 mL | Freq: Four times a day (QID) | ORAL | 0 refills | Status: DC | PRN

## 2023-04-21 NOTE — ED Notes (Signed)
Pt states cough and body aches for a few days.

## 2023-04-21 NOTE — ED Triage Notes (Signed)
Pt states cough and body aches and nasal congestion for 2 days. NAD noted.

## 2023-04-21 NOTE — ED Provider Triage Note (Signed)
Emergency Medicine Provider Triage Evaluation Note  Barbara Bates , a 48 y.o. female  was evaluated in triage.  Pt complains of cough congestion body aches fever for 2 days.  Review of Systems  Positive:  Negative:   Physical Exam  LMP 12/19/2017 (Exact Date)  Gen:   Awake, no distress   Resp:  Normal effort  MSK:   Moves extremities without difficulty  Other:    Medical Decision Making  Medically screening exam initiated at 10:10 AM.  Appropriate orders placed.  Barbara Bates was informed that the remainder of the evaluation will be completed by another provider, this initial triage assessment does not replace that evaluation, and the importance of remaining in the ED until their evaluation is complete.     Barbara Ghee, PA-C 04/21/23 1011

## 2023-04-28 NOTE — ED Provider Notes (Signed)
   [facility] Provider Note    Event Date/Time   First MD Initiated Contact with Patient 04/21/23 1037     (approximate)   History   Cough   HPI  Barbara Bates is a 48 y.o. female who presents with complaints of cough congestion body aches and chills over the last 1 to 2 days.  She denies shortness of breath     Physical Exam   Triage Vital Signs: ED Triage Vitals  Encounter Vitals Group     BP 04/21/23 1010 (!) 146/88     Systolic BP Percentile --      Diastolic BP Percentile --      Pulse Rate 04/21/23 1010 (!) 110     Resp 04/21/23 1010 18     Temp 04/21/23 1010 98.8 F (37.1 C)     Temp Source 04/21/23 1010 Oral     SpO2 04/21/23 1010 95 %     Weight 04/21/23 1011 61.7 kg (136 lb)     Height 04/21/23 1011 1.676 m (5\' 6" )     Head Circumference --      Peak Flow --      Pain Score 04/21/23 1042 4     Pain Loc --      Pain Education --      Exclude from Growth Chart --     Most recent vital signs: Vitals:   04/21/23 1010  BP: (!) 146/88  Pulse: (!) 110  Resp: 18  Temp: 98.8 F (37.1 C)  SpO2: 95%     General: Awake, no distress.  CV:  Good peripheral perfusion.  Resp:  Normal effort.  Clear to auscultation bilaterally Abd:  No distention.  Other:     ED Results / Procedures / Treatments   Labs (all labs ordered are listed, but only abnormal results are displayed) Labs Reviewed  RESP PANEL BY RT-PCR (RSV, FLU A&B, COVID)  RVPGX2 - Abnormal; Notable for the following components:      Result Value   Influenza A by PCR POSITIVE (*)    All other components within normal limits     EKG     RADIOLOGY Chest x-ray viewed to read by me, no pneumonia    PROCEDURES:  Critical Care performed:   Procedures   MEDICATIONS ORDERED IN ED: Medications - No data to display   IMPRESSION / MDM / ASSESSMENT AND PLAN / ED COURSE  I reviewed the triage vital signs and the nursing notes. Patient's presentation is most  consistent with acute complicated illness / injury requiring diagnostic workup.  Patient presents with viral symptoms as above, differential includes pneumonia  PCR is positive for influenza A, chest x-ray is reassuring, recommend supportive care, outpatient follow-up        FINAL CLINICAL IMPRESSION(S) / ED DIAGNOSES   Final diagnoses:  Influenza A     Rx / DC Orders   ED Discharge Orders          Ordered    guaiFENesin-codeine 100-10 MG/5ML syrup  Every 6 hours PRN,   Status:  Discontinued        04/21/23 1105             Note:  This document was prepared using Dragon voice recognition software and may include unintentional dictation errors.   Jene Every, MD 04/28/23 2300

## 2023-04-30 ENCOUNTER — Other Ambulatory Visit: Payer: Self-pay | Admitting: Certified Nurse Midwife

## 2023-04-30 DIAGNOSIS — Z1231 Encounter for screening mammogram for malignant neoplasm of breast: Secondary | ICD-10-CM

## 2023-05-09 ENCOUNTER — Ambulatory Visit: Payer: BC Managed Care – PPO | Admitting: Nurse Practitioner

## 2023-05-09 DIAGNOSIS — Z113 Encounter for screening for infections with a predominantly sexual mode of transmission: Secondary | ICD-10-CM

## 2023-05-09 DIAGNOSIS — Z708 Other sex counseling: Secondary | ICD-10-CM

## 2023-05-09 NOTE — Progress Notes (Signed)
 Pt is here to discuss her Syphilis results.  Condoms declined.  Ferrell Hu, RN

## 2023-05-10 ENCOUNTER — Encounter: Payer: Self-pay | Admitting: Nurse Practitioner

## 2023-05-10 NOTE — Progress Notes (Signed)
 [facility] Department STI clinic [address] [place] [address] Main phone: [phone]  STI screening visit  Subjective:  Barbara Bates is a 48 y.o. female being seen today for an STI screening visit. The patient reports they do not have symptoms.  Patient reports that they do not desire a pregnancy in the next year.   They reported they are not interested in discussing contraception today.    Patient's last menstrual period was 12/19/2017 (exact date).  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    HPI Patient is a pleasant 48 y.o. female who present to the clinic today as a referral from her primary OBGYN to discuss possible syphilis diagnosis and treatment. The patient was seen by her OBGYN office last week and testing for syphilis was reactive with 1:2 ratio. 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 Since then, her number was 1:4 in 2021 indicating a positive and appropriate response to treatment. Patient also mentions brown spots to the palmar side of hands and soles of feet that return intermittently with spontaneous resolve. Patient denies any known partners with syphilis. She denies having a chancre that she knows of. Pt also denies symptoms of neurosyphilis including changes with vision, balance, or walking.  Patient is post-menopausal. She reports 1 female partner and practices vaginal sex. Patient does not use condoms.    Last HIV test per patient/review of record was  Lab Results   Component Value Date   HMHIVSCREEN Negative - Validated 11/12/2019    Lab Results  Component Value Date   HIV Non Reactive 08/07/2022     Last HEPC test per patient/review of record was  Lab Results  Component Value Date   HMHEPCSCREEN Negative-Validated 11/12/2019   No components found for: HEPC   Last HEPB test per patient/review of record was No components found for: HMHEPBSCREEN   Patient reports last pap was:  Result Date Procedure Results Follow-ups  11/15/2020 IGP, Aptima HPV Interpretation: NILM Category: NIL Adequacy: SECNI Clinician Provided ICD10: Comment Performed by:: Comment Note:: Comment Test Methodology: Comment HPV Aptima: Negative     Screening for MPX risk: Does the patient have an unexplained rash? Yes Is the patient MSM? No Does the patient endorse multiple sex partners or anonymous sex partners? No Did the patient have close or sexual contact with a person diagnosed with MPX? No Has the patient traveled outside the [place]  where MPX is endemic? No Is there a high clinical suspicion for MPX-- evidenced by one of the following No  -Unlikely to be chickenpox  -Lymphadenopathy  -Rash that present in same phase of evolution on any given body part See flowsheet for further details and programmatic requirements.   Immunization history:  Immunization History  Administered Date(s) Administered   PFIZER(Purple Top)SARS-COV-2 Vaccination 11/15/2019     The following portions of the patient's history were reviewed and updated as appropriate: allergies, current medications, past medical history, past social history, past surgical history and problem list.  Objective:  There were no vitals filed for this visit.  Physical Exam Nursing note reviewed.  Constitutional:      Appearance: Normal appearance.  HENT:     Head: Normocephalic.     Mouth/Throat:     Lips: Pink. No lesions.  Eyes:     General:        Right eye: No discharge.        Left eye:  No discharge.  Pulmonary:     Effort: Pulmonary effort is normal.  Genitourinary:    Comments: Patient asymptomatic. Declines genital exam and testing today.  Skin:    General: Skin is warm and dry.     Comments: Skin tone appropriate for ethnicity. Bilateral palms of hands with scattered, very small, raised, slightly darker than surrounding tissue, round, non-pustular papules too small to measure.  Very dry and peeling skin present to soles of feet bilaterally.   Neurological:     Mental Status: She is alert and oriented to person, place, and time.  Psychiatric:        Attention and Perception: Attention and perception normal.        Mood and Affect: Mood and affect normal.        Speech: Speech normal.        Behavior: Behavior normal. Behavior is cooperative.        Thought Content: Thought content normal.     Assessment and Plan:  Barbara Bates is a 48 y.o. female presenting to the [facility] Department for STI screening  1. Encounter for sexually transmitted disease counseling (Primary) This provider consulted supervising physician (Dr. Macario) regarding patient syphilis results and PE findings and requested her to provide her medical opinion on the pt's PE findings. Dr. Macario agreed with this provider that the skin concern the patient has on palms of hands and soles of feet is not consistent with syphilitic rash in appearance or presentation.  Patient offered repeat syphilis testing today in office and declined.  With the patient's syphilis ratio down to 1:2 (less than previous results) with no signs of syphilitic rash to any part of pt body, there is no indication of new syphilis exposure. Therefore, patient was not treated for syphilis in office today.  Recommended patient see PCP for derm referral regarding hands and feet.  Advised patient to return to clinic if symptoms worsen or new symptoms appear.  Patient verbalized understanding and agreed with plan.   Patient  did NOT accept any screenings today, as screenings where performed 1 week ago at primary OBGYN office.  Patient meets criteria for HepB screening? Yes. Ordered? No-Patient declined. Patient meets criteria for HepC screening? Yes. Ordered? no-Patient declined.  Counseled to return or seek care for continued or worsening symptoms  Recommended condom use with all sex for STI prevention.   Patient is currently using  postmenopausal  to prevent pregnancy.    Return if symptoms worsen or fail to improve.  No future appointments.  The evaluation and management of this patient encounter were coded based on the total time (20 minutes) spent both face-to-face and non-face-to-face, including pre-visit, intra-visit, and post-visit activities. This time also accounts for the management of the patient, which included interpreting necessary screening/diagnostic tests, requesting a physician consult, and making referrals as indicated.   Clarita LITTIE Narrow, NP

## 2023-09-22 ENCOUNTER — Encounter

## 2023-10-13 ENCOUNTER — Ambulatory Visit

## 2023-10-13 DIAGNOSIS — K5730 Diverticulosis of large intestine without perforation or abscess without bleeding: Secondary | ICD-10-CM | POA: Diagnosis not present

## 2023-10-13 DIAGNOSIS — Z1211 Encounter for screening for malignant neoplasm of colon: Secondary | ICD-10-CM | POA: Diagnosis present

## 2023-10-13 DIAGNOSIS — D125 Benign neoplasm of sigmoid colon: Secondary | ICD-10-CM | POA: Diagnosis not present

## 2023-12-24 ENCOUNTER — Other Ambulatory Visit: Payer: Self-pay | Admitting: Family Medicine

## 2023-12-24 DIAGNOSIS — Z1231 Encounter for screening mammogram for malignant neoplasm of breast: Secondary | ICD-10-CM

## 2024-02-09 ENCOUNTER — Emergency Department

## 2024-02-09 ENCOUNTER — Emergency Department
Admission: EM | Admit: 2024-02-09 | Discharge: 2024-02-09 | Disposition: A | Discharge status: Home / Self Care | Attending: Emergency Medicine | Admitting: Emergency Medicine

## 2024-02-09 ENCOUNTER — Encounter: Payer: Self-pay | Admitting: Emergency Medicine

## 2024-02-09 ENCOUNTER — Other Ambulatory Visit: Payer: Self-pay

## 2024-02-09 DIAGNOSIS — M545 Low back pain, unspecified: Secondary | ICD-10-CM | POA: Insufficient documentation

## 2024-02-09 DIAGNOSIS — G8929 Other chronic pain: Secondary | ICD-10-CM | POA: Insufficient documentation

## 2024-02-09 MED ORDER — MELOXICAM 15 MG PO TABS: 15.0000 mg | ORAL_TABLET | Freq: Every day | ORAL | 0 refills | Status: AC

## 2024-02-09 MED ORDER — KETOROLAC TROMETHAMINE 30 MG/ML IJ SOLN
30.0000 mg | Freq: Once | INTRAMUSCULAR | Status: AC
  Administered 2024-02-09: 30 mg via INTRAMUSCULAR
  Filled 2024-02-09: qty 1

## 2024-02-09 MED ORDER — CYCLOBENZAPRINE HCL 10 MG PO TABS
5.0000 mg | ORAL_TABLET | Freq: Once | ORAL | Status: AC
  Administered 2024-02-09: 5 mg via ORAL
  Filled 2024-02-09: qty 1

## 2024-02-09 MED ORDER — CYCLOBENZAPRINE HCL 5 MG PO TABS: 5.0000 mg | ORAL_TABLET | Freq: Three times a day (TID) | ORAL | 0 refills | Status: AC | PRN

## 2024-02-09 MED ORDER — LIDOCAINE 5 % EX PTCH: 1.0000 | MEDICATED_PATCH | CUTANEOUS | 0 refills | Status: AC

## 2024-02-09 NOTE — ED Provider Notes (Signed)
 [facility] Emergency Department Provider Note     Event Date/Time   First MD Initiated Contact with Patient 02/09/24 1145     (approximate)   History   Back Pain   HPI  Barbara Bates is a 48 y.o. female with a past medical history of pancreatitis presents to the ED for evaluation of nontraumatic  lower back pain that shoots up her back.  Patient reports this has been going on for quite some time now, but became more severe last night.  She denies radiation down her lower extremities, saddle anesthesia or loss of bowel or bladder control.  Patient denies injury or falls.  No abdominal pain.  She has been trying Tylenol  with minimal improvement.  Denies urinary symptoms, fever, chest pain or shortness of breath.     Physical Exam   Triage Vital Signs: ED Triage Vitals [02/09/24 1132]  Encounter Vitals Group     BP (!) 144/94     Girls Systolic BP Percentile      Girls Diastolic BP Percentile      Boys Systolic BP Percentile      Boys Diastolic BP Percentile      Pulse Rate 88     Resp 16     Temp 98.2 F (36.8 C)     Temp Source Oral     SpO2 100 %     Weight      Height      Head Circumference      Peak Flow      Pain Score 8     Pain Loc      Pain Education      Exclude from Growth Chart     Most recent vital signs: Vitals:   02/09/24 1132 02/09/24 1614  BP: (!) 144/94 138/80  Pulse: 88 80  Resp: 16 16  Temp: 98.2 F (36.8 C)   SpO2: 100% 100%    General Awake, no distress.  HEENT NCAT.  CV:  Good peripheral perfusion.  RESP:  Normal effort.  ABD:  No distention.  Other:  Spinous process is midline without deformity.  Tenderness along midline and paraspinal muscles of the lumbar and thoracic region.  Gait is steady.  Normal lower extremity muscle strength bilaterally.   ED Results / Procedures / Treatments   Labs (all labs ordered are listed, but only abnormal results are displayed) Labs Reviewed  URINALYSIS, ROUTINE  W REFLEX MICROSCOPIC   RADIOLOGY  I personally viewed and evaluated these images as part of my medical decision making, as well as reviewing the written report by the radiologist.  CT Lumbar Spine Wo Contrast Result Date: 02/09/2024 EXAM: CT OF THE LUMBAR SPINE WITHOUT CONTRAST 02/09/2024 02:43:00 PM TECHNIQUE: CT of the lumbar spine was performed without the administration of intravenous contrast. Multiplanar reformatted images are provided for review. Automated exposure control, iterative reconstruction, and/or weight based adjustment of the mA/kV was utilized to reduce the radiation dose to as low as reasonably achievable. COMPARISON: Radiographs dated 02/09/2024. CLINICAL HISTORY: Low back pain, increased fracture risk. FINDINGS: BONES AND ALIGNMENT: Sacralized L5 vertebra with only 4 lumbar-type vertebrae. Old healed right transverse process fracture at L3. No acute fracture, abnormal subluxation, or acute bony findings. Normal vertebral body heights. Normal alignment. DEGENERATIVE CHANGES: Mild disc bulge at L3-L4. Mild right eccentric disc bulge and small right paracentral disc protrusion at L4-L5. No impingement identified. SOFT TISSUES: 1.5 cm hypodense lesion in the pancreatic head on image 41  series 4. Given the history of pancreatitis, this could represent a postinflammatory cystic lesion such as a small pseudocyst but a pancreatic neoplasm is not excluded. Consider dedicated pancreatic protocol MRI with and without contrast for definitive characterization. No acute abnormality. IMPRESSION: 1. No acute fracture or abnormal subluxation. 2. 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 3. Fully sacralized L5 vertebra. 4. 1.5 cm hypodense lesion in the pancreatic head, possibly postinflammatory or malignant/neoplastic ; recommend dedicated pancreatic protocol MRI with and without contrast for definitive characterization. Electronically signed by: Ryan Salvage MD 02/09/2024  03:16 PM EST RP Workstation: [id number]   DG Lumbar Spine Complete Result Date: 02/09/2024 EXAM: 4 VIEW(S) XRAY OF THE LUMBAR SPINE 02/09/2024 12:45:34 PM COMPARISON: Abdominal pelvic CT 08/09/2022. CLINICAL HISTORY: back pain. Intermittent low back pain worsening since last night. No known injury FINDINGS: LUMBAR SPINE: BONES: Transitional, largely sacralized L5 segment. Deformity of the right L3 transverse process suspicious for a fracture, age indeterminate. This appears new compared with previous CT. No evidence of vertebral body fracture. Alignment is normal. No aggressive appearing osseous lesion. DISCS AND DEGENERATIVE CHANGES: The lumbar disc spaces are resolved. Non facet degenerative changes inferiorly. SOFT TISSUES: No acute abnormality. HIPS: Mild degenerative changes at both hips. IMPRESSION: 1. Suspected, age indeterminate right L3 transverse process fracture. No other acute osseous findings. 2. Transitional, largely sacralized L5 segment. Electronically signed by: Elsie Perone MD 02/09/2024 01:20 PM EST RP Workstation: [id number]   DG Thoracic Spine 2 View Result Date: 02/09/2024 EXAM: 2 VIEW(S) XRAY OF THE THORACIC SPINE 02/09/2024 12:45:34 PM COMPARISON: Chest radiographs 04/21/2023. CLINICAL HISTORY: Intermittent low back pain worsening since last night. No known injury. FINDINGS: BONES: Conventional anatomy with 12 ribs. No acute fracture of the thoracic vertebral bodies. No aggressive appearing osseous lesion. Alignment is normal. DISCS AND DEGENERATIVE CHANGES: The thoracic disc spaces are preserved. No widening of the interpedicular distance. No significant degenerative changes in the thoracic spine; there is lower cervical spondylosis. SOFT TISSUES: The visualized lungs are clear. IMPRESSION: 1. No acute abnormality of the thoracic spine. Electronically signed by: Elsie Perone MD 02/09/2024 01:14 PM EST RP Workstation: [id number]    PROCEDURES:  Critical Care performed:  No  Procedures  MEDICATIONS ORDERED IN ED: Medications  ketorolac (TORADOL) 30 MG/ML injection 30 mg (30 mg Intramuscular Given 02/09/24 1222)  cyclobenzaprine (FLEXERIL) tablet 5 mg (5 mg Oral Given 02/09/24 1222)    IMPRESSION / MDM / ASSESSMENT AND PLAN / ED COURSE  I reviewed the triage vital signs and the nursing notes.                              Clinical Course as of 02/09/24 1629  Mon Feb 09, 2024  1546 On reassessment patient reports improvement with ED pain control. [MH]  1603 Patient evaluated coordination with treating APP.  Has been having intermittent low back pain sometimes extending up into thoracic region.  CT imaging performed, no acute pathology identified, no bony abnormalities but does have nonspecific changes around pancreas.  My chart review, has been admitted for necrotic pancreatitis last year.  Overall I suspect likely postinflammatory changes from known pancreatitis issues as opposed to underlying malignancy at this time but did discuss this possibility with patient.  He is established with a primary care doctor, recommend she follow-up with them for possible outpatient follow-up imaging.  Abdominal exam with no tenderness to palpation throughout.  No nausea or vomiting.  Do  not suspect pancreatitis at this time.  Has been using Tylenol  with minimal relief.  Motrin, Lidoderm patches.  Can also continue muscle relaxant as needed.  No concern for acute spinal pathology at this time, reassuring exam. [MM]    Clinical Course User Index [MH] Margrette Rebbeca LABOR, PA-C [MM] Clarine Ozell LABOR, MD    48 y.o. female presents to the emergency department for evaluation and treatment of back pain. See HPI for further details.   Differential diagnosis includes, but is not limited to fracture, herniated disc, radiculopathy, strain, muscle spasm  Patient's presentation is most consistent with acute complicated illness / injury requiring diagnostic workup.  Patient is alert and  oriented.  She is hemodynamically stable with initial BP of 144/94 and afebrile.  Physical exam findings are stated above.  Pertinent for diffuse tenderness along the midline spinal process of the lumbar and thoracic region.  Given nontraumatic history low suspicion for compression fracture.  Will screen with thoracic and lumbar radiographs.  Suspected age-indeterminate right L3 transverse process fracture read on lumbar screen.  Will obtain CT of the lumbar spine.  CT lumbar reveals no acute bony abnormality.  Incidental finding of 1.5 cm hypodense lesion in the pancreatic head noted by radiologist.  On further chart review patient has a history of necrotizing pancreatitis.  Previous CT abdomen pelvis image reviewed noting an increased focal hypodensity involving the inferior pancreatic head from 05/24 reviewed.  Suspect inflammatory changes versus neoplasm.  Given no abdominal pain, do not feel need for further workup as I have low suspicion that this is due to pancreatic pain.  This was discussed with patient and she verbalized understanding is in agreement with this care plan.  We advised close follow-up with primary care provider for further management.  Discussed case with supervising attending, Dr. Clarine, who is in agreement with this care plan. She is in stable condition for discharge home.  ED return precaution discussed. All questions and concerns were addressed during this ED visit.     FINAL CLINICAL IMPRESSION(S) / ED DIAGNOSES   Final diagnoses:  Chronic low back pain without sciatica, unspecified back pain laterality   Rx / DC Orders   ED Discharge Orders          Ordered    meloxicam (MOBIC) 15 MG tablet  Daily        02/09/24 1622    cyclobenzaprine (FLEXERIL) 5 MG tablet  3 times daily PRN        02/09/24 1622    lidocaine (LIDODERM) 5 %  Every 24 hours        02/09/24 1622            Note:  This document was prepared using Dragon voice recognition software and may  include unintentional dictation errors.    Margrette, Karinda Cabriales A, PA-C 02/09/24 1721    Bradler, Evan K, MD 02/10/24 1332

## 2024-02-09 NOTE — ED Notes (Signed)
 See triage note  Presents with intermittent lower back pain for the past couple of days  Unsure of injury

## 2024-02-09 NOTE — ED Triage Notes (Signed)
 C/O back pain x 1 day. Denies injury. Denies dysuria.  Taken tylenol  without relief.  AAOx3. Skin warm and dry. Posture upright and relaxed. NAD

## 2024-02-09 NOTE — Discharge Instructions (Addendum)
 Your evaluated in the ED for back pain.  Your CT lumbar and thoracic x-rays are normal.  There is a incidental finding of a 1.5 cm hypodense lesion in the pancreatic head that we suspect is due to inflammatory changes given your pancreatic history.  Please follow-up with your primary care provider for further monitoring and management.  In the interim, get plenty of rest.  Pain control:  Ibuprofen (motrin/aleve/advil) - You can take 3 tablets (600 mg) every 6 hours as needed for pain/fever.  Acetaminophen  (tylenol ) - You can take 2 extra strength tablets (1000 mg) every 6 hours as needed for pain/fever.  You can alternate these medications or take them together.  Make sure you eat food/drink water when taking these medications.

## 2024-02-11 ENCOUNTER — Ambulatory Visit
Admission: RE | Admit: 2024-02-11 | Discharge: 2024-02-11 | Disposition: A | Discharge status: Home / Self Care | Source: Ambulatory Visit | Attending: Family Medicine | Admitting: Family Medicine

## 2024-02-11 DIAGNOSIS — Z1231 Encounter for screening mammogram for malignant neoplasm of breast: Secondary | ICD-10-CM | POA: Insufficient documentation

## 2024-04-19 ENCOUNTER — Emergency Department
Admission: EM | Admit: 2024-04-19 | Discharge: 2024-04-19 | Disposition: A | Discharge status: Home / Self Care | Attending: Emergency Medicine | Admitting: Emergency Medicine

## 2024-04-19 ENCOUNTER — Other Ambulatory Visit: Payer: Self-pay

## 2024-04-19 DIAGNOSIS — M791 Myalgia, unspecified site: Secondary | ICD-10-CM | POA: Insufficient documentation

## 2024-04-19 DIAGNOSIS — Z720 Tobacco use: Secondary | ICD-10-CM | POA: Insufficient documentation

## 2024-04-19 DIAGNOSIS — R112 Nausea with vomiting, unspecified: Secondary | ICD-10-CM | POA: Insufficient documentation

## 2024-04-19 DIAGNOSIS — R197 Diarrhea, unspecified: Secondary | ICD-10-CM | POA: Diagnosis not present

## 2024-04-19 DIAGNOSIS — J029 Acute pharyngitis, unspecified: Secondary | ICD-10-CM | POA: Insufficient documentation

## 2024-04-19 LAB — URINALYSIS, ROUTINE W REFLEX MICROSCOPIC
Bilirubin Urine: NEGATIVE
Glucose, UA: NEGATIVE mg/dL
Hgb urine dipstick: NEGATIVE
Ketones, ur: NEGATIVE mg/dL
Leukocytes,Ua: NEGATIVE
Nitrite: NEGATIVE
Protein, ur: 30 mg/dL — AB
Specific Gravity, Urine: 1.025 (ref 1.005–1.030)
pH: 6 (ref 5.0–8.0)

## 2024-04-19 LAB — CBC
HCT: 36 % (ref 36.0–46.0)
Hemoglobin: 12.4 g/dL (ref 12.0–15.0)
MCH: 33.7 pg (ref 26.0–34.0)
MCHC: 34.4 g/dL (ref 30.0–36.0)
MCV: 97.8 fL (ref 80.0–100.0)
Platelets: 285 K/uL (ref 150–400)
RBC: 3.68 MIL/uL — ABNORMAL LOW (ref 3.87–5.11)
RDW: 11.9 % (ref 11.5–15.5)
WBC: 5.2 K/uL (ref 4.0–10.5)
nRBC: 0 % (ref 0.0–0.2)

## 2024-04-19 LAB — COMPREHENSIVE METABOLIC PANEL WITH GFR
ALT: 16 U/L (ref 0–44)
AST: 43 U/L — ABNORMAL HIGH (ref 15–41)
Albumin: 4 g/dL (ref 3.5–5.0)
Alkaline Phosphatase: 123 U/L (ref 38–126)
Anion gap: 11 (ref 5–15)
BUN: 12 mg/dL (ref 6–20)
CO2: 28 mmol/L (ref 22–32)
Calcium: 9 mg/dL (ref 8.9–10.3)
Chloride: 103 mmol/L (ref 98–111)
Creatinine, Ser: 0.78 mg/dL (ref 0.44–1.00)
GFR, Estimated: >60 mL/min
Glucose, Bld: 98 mg/dL (ref 70–99)
Potassium: 3.8 mmol/L (ref 3.5–5.1)
Sodium: 142 mmol/L (ref 135–145)
Total Bilirubin: 0.6 mg/dL (ref 0.0–1.2)
Total Protein: 7.2 g/dL (ref 6.5–8.1)

## 2024-04-19 LAB — LIPASE, BLOOD: Lipase: 18 U/L (ref 11–51)

## 2024-04-19 MED ORDER — SODIUM CHLORIDE 0.9 % IV BOLUS
1000.0000 mL | Freq: Once | INTRAVENOUS | Status: AC
  Administered 2024-04-19: 1000 mL via INTRAVENOUS

## 2024-04-19 MED ORDER — ONDANSETRON 4 MG PO TBDP: 4.0000 mg | ORAL_TABLET | Freq: Three times a day (TID) | ORAL | 0 refills | Status: AC | PRN

## 2024-04-19 MED ORDER — ONDANSETRON HCL 4 MG/2ML IJ SOLN
4.0000 mg | Freq: Once | INTRAMUSCULAR | Status: AC
  Administered 2024-04-19: 4 mg via INTRAVENOUS
  Filled 2024-04-19: qty 2

## 2024-04-19 MED ORDER — FAMOTIDINE IN NACL 20-0.9 MG/50ML-% IV SOLN
20.0000 mg | Freq: Once | INTRAVENOUS | Status: AC
  Administered 2024-04-19: 20 mg via INTRAVENOUS
  Filled 2024-04-19: qty 50

## 2024-04-19 MED ORDER — METOCLOPRAMIDE HCL 5 MG/ML IJ SOLN
10.0000 mg | Freq: Once | INTRAMUSCULAR | Status: AC
  Administered 2024-04-19: 10 mg via INTRAVENOUS
  Filled 2024-04-19: qty 2

## 2024-04-19 NOTE — ED Triage Notes (Signed)
 Pt comes with vomiting, diarrhea and might be dehydrated. Pt states  this started a few days ago. Pt states body aches and sore throat.

## 2024-04-19 NOTE — Discharge Instructions (Addendum)
 Your exam and labs are normal and reassuring.  You were treated with a fluid bolus as well as IV antinausea medicines.  Take prescription antinausea medicine as prescribed.  Follow-up with your primary provider or return to the ED if needed.

## 2024-04-19 NOTE — ED Provider Notes (Signed)
 "   [facility] Emergency Department Provider Note     Event Date/Time   First MD Initiated Contact with Patient 04/19/24 1212     (approximate)   History   Emesis   HPI  Barbara Bates is a 49 y.o. female with a history of pancreatitis, EtOH use disorder, and tobacco use, presents to the ED endorsing nausea, vomiting, and diarrhea.  Patient reports several days with the symptoms, and also is endorsing some bodyaches as well as a sore throat.  Denies any frank fevers.  No bloody or bilious emesis is reported.  No dark stools or hematochezia reported.    Physical Exam   Triage Vital Signs: ED Triage Vitals  Encounter Vitals Group     BP 04/19/24 1155 (!) 154/109     Girls Systolic BP Percentile --      Girls Diastolic BP Percentile --      Boys Systolic BP Percentile --      Boys Diastolic BP Percentile --      Pulse Rate 04/19/24 1155 (!) 108     Resp 04/19/24 1155 18     Temp 04/19/24 1155 99.7 F (37.6 C)     Temp src --      SpO2 04/19/24 1155 99 %     Weight 04/19/24 1153 130 lb (59 kg)     Height 04/19/24 1153 5' 7 (1.702 m)     Head Circumference --      Peak Flow --      Pain Score 04/19/24 1153 0     Pain Loc --      Pain Education --      Exclude from Growth Chart --     Most recent vital signs: Vitals:   04/19/24 1155 04/19/24 1515  BP: (!) 154/109 (!) 142/88  Pulse: (!) 108 88  Resp: 18 18  Temp: 99.7 F (37.6 C)   SpO2: 99% 99%    General Awake, no distress.  NAD HEENT NCAT. PERRL. EOMI. No rhinorrhea. Mucous membranes are moist.  CV:  Good peripheral perfusion. RRR RESP:  Normal effort. CTA ABD:  No distention.  Soft and nontender.  No rebound, guarding, or rigidity noted.   ED Results / Procedures / Treatments   Labs (all labs ordered are listed, but only abnormal results are displayed) Labs Reviewed  COMPREHENSIVE METABOLIC PANEL WITH GFR - Abnormal; Notable for the following components:      Result Value    AST 43 (*)    All other components within normal limits  CBC - Abnormal; Notable for the following components:   RBC 3.68 (*)    All other components within normal limits  URINALYSIS, ROUTINE W REFLEX MICROSCOPIC - Abnormal; Notable for the following components:   Color, Urine YELLOW (*)    APPearance HAZY (*)    Protein, ur 30 (*)    Bacteria, UA RARE (*)    All other components within normal limits  LIPASE, BLOOD    EKG   RADIOLOGY   No results found.   PROCEDURES:  Critical Care performed: No  Procedures   MEDICATIONS ORDERED IN ED: Medications  metoCLOPramide  (REGLAN ) injection 10 mg (has no administration in time range)  sodium chloride  0.9 % bolus 1,000 mL (1,000 mLs Intravenous New Bag/Given 04/19/24 1300)  ondansetron  (ZOFRAN ) injection 4 mg (4 mg Intravenous Given 04/19/24 1259)  famotidine  (PEPCID ) IVPB 20 mg premix (0 mg Intravenous Stopped 04/19/24 1330)     IMPRESSION /  MDM / ASSESSMENT AND PLAN / ED COURSE  I reviewed the triage vital signs and the nursing notes.                              Differential diagnosis includes, but is not limited to, biliary disease (biliary colic, acute cholecystitis, cholangitis, choledocholithiasis, etc), intrathoracic causes for epigastric abdominal pain including ACS, gastritis, duodenitis, pancreatitis, small bowel or large bowel obstruction, abdominal aortic aneurysm, hernia, and ulcer(s).   Patient's presentation is most consistent with acute complicated illness / injury requiring diagnostic workup.  Patient's diagnosis is consistent with nausea and vomiting of an unclear etiology.  Patient with reassuring exam and workup at this time.  She presents to the ED afebrile, in no acute distress, nontoxic in appearance.  Labs and overall workup are reassuring.  No acute abdominal pain or discomfort reported.  Patient endorses improvement of her symptoms after IV fluid bolus and IV medication administration.  She tolerated  p.o. without subsequent emesis.  She feels stable at this time for discharge and outpatient follow-up.  Patient will be discharged home with prescriptions for Zofran . Patient is to follow up with her primary provider as suggested, as needed or otherwise directed. Patient is given ED precautions to return to the ED for any worsening or new symptoms.   FINAL CLINICAL IMPRESSION(S) / ED DIAGNOSES   Final diagnoses:  Nausea and vomiting, unspecified vomiting type     Rx / DC Orders   ED Discharge Orders          Ordered    ondansetron  (ZOFRAN -ODT) 4 MG disintegrating tablet  Every 8 hours PRN        04/19/24 1514             Note:  This document was prepared using Dragon voice recognition software and may include unintentional dictation errors.    Loyd Candida LULLA Aldona, PA-C 04/19/24 1521  "

## 2024-04-19 NOTE — ED Notes (Signed)
 See triage note  Presents with n/v  States this started on Friday Low grade temp on arrival
